# Patient Record
Sex: Female | Born: 1980 | Race: Black or African American | Hispanic: No | Marital: Single | Smoking: Former smoker
Health system: Southern US, Community
[De-identification: ages and names within clinical notes are randomized; demographics above are authoritative.]

## PROBLEM LIST (undated history)

## (undated) DIAGNOSIS — E669 Obesity, unspecified: Secondary | ICD-10-CM

## (undated) DIAGNOSIS — I2699 Other pulmonary embolism without acute cor pulmonale: Secondary | ICD-10-CM

## (undated) DIAGNOSIS — I10 Essential (primary) hypertension: Secondary | ICD-10-CM

## (undated) DIAGNOSIS — I219 Acute myocardial infarction, unspecified: Secondary | ICD-10-CM

## (undated) DIAGNOSIS — M549 Dorsalgia, unspecified: Secondary | ICD-10-CM

## (undated) HISTORY — PX: CORONARY ANGIOPLASTY WITH STENT PLACEMENT: SHX49

## (undated) HISTORY — PX: TUBAL LIGATION: SHX77

## (undated) HISTORY — PX: LAMINECTOMY: SHX219

---

## 2012-02-17 ENCOUNTER — Emergency Department (HOSPITAL_COMMUNITY)
Admission: EM | Admit: 2012-02-17 | Discharge: 2012-02-17 | Disposition: A | Discharge status: Home / Self Care | Payer: Medicaid - Out of State | Attending: Emergency Medicine | Admitting: Emergency Medicine

## 2012-02-17 ENCOUNTER — Encounter (HOSPITAL_COMMUNITY): Payer: Self-pay | Admitting: *Deleted

## 2012-02-17 ENCOUNTER — Emergency Department (HOSPITAL_COMMUNITY): Payer: Medicaid - Out of State

## 2012-02-17 DIAGNOSIS — R51 Headache: Secondary | ICD-10-CM | POA: Insufficient documentation

## 2012-02-17 DIAGNOSIS — R112 Nausea with vomiting, unspecified: Secondary | ICD-10-CM | POA: Insufficient documentation

## 2012-02-17 DIAGNOSIS — J3489 Other specified disorders of nose and nasal sinuses: Secondary | ICD-10-CM | POA: Insufficient documentation

## 2012-02-17 DIAGNOSIS — J40 Bronchitis, not specified as acute or chronic: Secondary | ICD-10-CM

## 2012-02-17 DIAGNOSIS — R0602 Shortness of breath: Secondary | ICD-10-CM | POA: Insufficient documentation

## 2012-02-17 DIAGNOSIS — IMO0001 Reserved for inherently not codable concepts without codable children: Secondary | ICD-10-CM | POA: Insufficient documentation

## 2012-02-17 DIAGNOSIS — R059 Cough, unspecified: Secondary | ICD-10-CM | POA: Insufficient documentation

## 2012-02-17 DIAGNOSIS — Z79899 Other long term (current) drug therapy: Secondary | ICD-10-CM | POA: Insufficient documentation

## 2012-02-17 DIAGNOSIS — R079 Chest pain, unspecified: Secondary | ICD-10-CM | POA: Insufficient documentation

## 2012-02-17 DIAGNOSIS — M549 Dorsalgia, unspecified: Secondary | ICD-10-CM | POA: Insufficient documentation

## 2012-02-17 DIAGNOSIS — R197 Diarrhea, unspecified: Secondary | ICD-10-CM | POA: Insufficient documentation

## 2012-02-17 DIAGNOSIS — R05 Cough: Secondary | ICD-10-CM | POA: Insufficient documentation

## 2012-02-17 DIAGNOSIS — J069 Acute upper respiratory infection, unspecified: Secondary | ICD-10-CM

## 2012-02-17 DIAGNOSIS — I10 Essential (primary) hypertension: Secondary | ICD-10-CM | POA: Insufficient documentation

## 2012-02-17 HISTORY — DX: Essential (primary) hypertension: I10

## 2012-02-17 MED ORDER — ALBUTEROL SULFATE HFA 108 (90 BASE) MCG/ACT IN AERS
2.0000 | INHALATION_SPRAY | Freq: Four times a day (QID) | RESPIRATORY_TRACT | Status: DC
  Administered 2012-02-17: 2 via RESPIRATORY_TRACT
  Filled 2012-02-17: qty 6.7

## 2012-02-17 NOTE — Discharge Instructions (Signed)
Bronchitis Bronchitis is a problem of the air tubes leading to your lungs. This problem makes it hard for air to get in and out of the lungs. You may cough a lot because your air tubes are narrow. Going without care can cause lasting (chronic) bronchitis. HOME CARE   Drink enough fluids to keep your pee (urine) clear or pale yellow.   Use a cool mist humidifier.   Quit smoking if you smoke. If you keep smoking, the bronchitis might not get better.   Only take medicine as told by your doctor.  GET HELP RIGHT AWAY IF:   Coughing keeps you awake.   You start to wheeze.   You become more sick or weak.   You have a hard time breathing or get short of breath.   You cough up blood.   Coughing lasts more than 2 weeks.   You have a fever.   Your baby is older than 3 months with a rectal temperature of 102 F (38.9 C) or higher.   Your baby is 79 months old or younger with a rectal temperature of 100.4 F (38 C) or higher.  MAKE SURE YOU:  Understand these instructions.   Will watch your condition.   Will get help right away if you are not doing well or get worse.  Document Released: 04/29/2008 Document Revised: 10/31/2011 Document Reviewed: 10/13/2009 [facility] Patient Information 2012 [place], [place].  Use albuterol inhaler as directed 2 puffs every 6 hours for at least one week then as needed. Also recommend Robitussin-DM over-the-counter cough medicine to help with the cough. Return for new worse symptoms.

## 2012-02-17 NOTE — ED Provider Notes (Signed)
History   This chart was scribed for Shelda Jakes, MD by Sofie Rower. The patient was seen in room APA07/APA07 and the patient's care was started at 7:44PM.    CSN: [id number]  Arrival date & time 02/17/12  1845   First MD Initiated Contact with Patient 02/17/12 1918      Chief Complaint  Patient presents with  . Shortness of Breath  . Generalized Body Aches  . Fever    (Consider location/radiation/quality/duration/timing/severity/associated sxs/prior treatment) HPI  Katie Pearson is a 31 y.o. female who presents to the Emergency Department complaining of moderate, intermittent productive green cough onset eight days ago with associated symptoms of myalgia, chest pain, headache, congestion, nausea, diarrhea, vomiting, back pain. Pt states she "has been experiencing muscle aches, which began to become worse this Sunday". Pt also informs the EDP that "she just began birth control pills six months ago". Modifying factors include tylenol, mucinex, ativan with moderate relief. Pt has a hx of high blood pressure.   Pt denies dysuria, abdominal pain, neck pain, swelling in the legs, rash.   LNMP was January, 2013.   PCP is Dr. Catalina Lunger in [place], [place].   Past Medical History  Diagnosis Date  . Hypertension     Past Surgical History  Procedure Date  . Cesarean section   . Tubal ligation   . Laminectomy     No family history on file.  History  Substance Use Topics  . Smoking status: Current Everyday Smoker  . Smokeless tobacco: Not on file  . Alcohol Use: No    OB History    Grav Para Term Preterm Abortions TAB SAB Ect Mult Living                  Review of Systems  All other systems reviewed and are negative.    10 Systems reviewed and are negative for acute change except as noted in the HPI.   Allergies  Ace inhibitors; Codeine; Latex; and Tape  Home Medications   Current Outpatient Rx  Name Route Sig Dispense Refill  . AMLODIPINE BESYLATE 5 MG PO TABS  Oral Take 5 mg by mouth daily.    Marland Kitchen CLONIDINE HCL 0.2 MG PO TABS Oral Take 0.2 mg by mouth 2 (two) times daily.    . FUROSEMIDE 40 MG PO TABS Oral Take 40 mg by mouth daily.    . GUAIFENESIN ER 600 MG PO TB12 Oral Take 1,200 mg by mouth 2 (two) times daily.    Marland Kitchen NORGESTREL-ETHINYL ESTRADIOL 0.3-30 MG-MCG PO TABS Oral Take 1 tablet by mouth daily.    . NYQUIL PO Oral Take 1 capsule by mouth as needed.      LMP 12/22/2011  Physical Exam  Nursing note and vitals reviewed. Constitutional: She is oriented to person, place, and time. She appears well-developed and well-nourished.  HENT:  Head: Normocephalic and atraumatic.  Right Ear: Tympanic membrane and external ear normal.  Left Ear: Tympanic membrane and external ear normal.  Nose: Nose normal.  Eyes: Conjunctivae and EOM are normal. No scleral icterus.  Neck: Neck supple. No thyromegaly present.  Cardiovascular: Normal rate, regular rhythm and normal heart sounds.  Exam reveals no gallop and no friction rub.   No murmur heard. Pulmonary/Chest: Breath sounds normal. No stridor. She has no wheezes. She has no rales. She exhibits no tenderness.  Abdominal: Bowel sounds are normal. She exhibits no distension. There is no tenderness. There is no rebound.  Musculoskeletal: Normal range of  motion. She exhibits no edema.  Lymphadenopathy:    She has no cervical adenopathy.  Neurological: She is alert and oriented to person, place, and time. Coordination normal.  Skin: Skin is warm and dry. No rash noted. No erythema.  Psychiatric: She has a normal mood and affect. Her behavior is normal.    ED Course  Procedures (including critical care time)  DIAGNOSTIC STUDIES:   COORDINATION OF CARE:     Results for orders placed during the hospital encounter of 02/17/12  POCT PREGNANCY, URINE      Component Value Range   Preg Test, Ur NEGATIVE  NEGATIVE    Dg Chest 2 View  02/17/2012  *RADIOLOGY REPORT*  Clinical Data: Shortness of breath   CHEST - 2 VIEW  Comparison: None  Findings: The heart size and mediastinal contours are within normal limits.  Both lungs are clear.  The visualized skeletal structures are unremarkable.  IMPRESSION: Negative exam.  Original Report Authenticated By: Rosealee Albee, M.D.        1. Upper respiratory infection   2. Bronchitis     7:51PM- EDP at bedside discusses treatment plan.   MDM  Suspect patient has upper respiratory infection viral in nature with bronchitis component. Chest x-ray negative for pneumonia no significant sore throat treat with albuterol inhaler and recommend Robitussin-DM for cough. Patient to return for new or worse symptoms. Also pregnancy test negative.      I personally performed the services described in this documentation, which was scribed in my presence. The recorded information has been reviewed and considered.  e   Shelda Jakes, MD 02/17/12 2136

## 2012-02-17 NOTE — ED Notes (Signed)
Pt presents to er with hoarness that started a week ago Sunday, pt states that she began worse since this Sunday with generalized body aches, chest pain, fever, cough with sputum production that is thick and yellow-green in color.

## 2014-09-08 ENCOUNTER — Emergency Department (HOSPITAL_COMMUNITY): Payer: Medicaid - Out of State

## 2014-09-08 ENCOUNTER — Emergency Department (HOSPITAL_COMMUNITY)
Admission: EM | Admit: 2014-09-08 | Discharge: 2014-09-08 | Disposition: A | Discharge status: Home / Self Care | Payer: Medicaid - Out of State | Attending: Emergency Medicine | Admitting: Emergency Medicine

## 2014-09-08 ENCOUNTER — Encounter (HOSPITAL_COMMUNITY): Payer: Self-pay | Admitting: Emergency Medicine

## 2014-09-08 DIAGNOSIS — Z91040 Latex allergy status: Secondary | ICD-10-CM | POA: Insufficient documentation

## 2014-09-08 DIAGNOSIS — Z79899 Other long term (current) drug therapy: Secondary | ICD-10-CM | POA: Insufficient documentation

## 2014-09-08 DIAGNOSIS — M545 Low back pain: Secondary | ICD-10-CM | POA: Diagnosis not present

## 2014-09-08 DIAGNOSIS — D369 Benign neoplasm, unspecified site: Secondary | ICD-10-CM

## 2014-09-08 DIAGNOSIS — M549 Dorsalgia, unspecified: Secondary | ICD-10-CM

## 2014-09-08 DIAGNOSIS — Z3202 Encounter for pregnancy test, result negative: Secondary | ICD-10-CM | POA: Diagnosis not present

## 2014-09-08 DIAGNOSIS — I10 Essential (primary) hypertension: Secondary | ICD-10-CM | POA: Diagnosis not present

## 2014-09-08 DIAGNOSIS — Z720 Tobacco use: Secondary | ICD-10-CM | POA: Diagnosis not present

## 2014-09-08 DIAGNOSIS — D279 Benign neoplasm of unspecified ovary: Secondary | ICD-10-CM | POA: Diagnosis not present

## 2014-09-08 DIAGNOSIS — G8929 Other chronic pain: Secondary | ICD-10-CM | POA: Insufficient documentation

## 2014-09-08 DIAGNOSIS — M546 Pain in thoracic spine: Secondary | ICD-10-CM | POA: Diagnosis not present

## 2014-09-08 DIAGNOSIS — Z86711 Personal history of pulmonary embolism: Secondary | ICD-10-CM | POA: Insufficient documentation

## 2014-09-08 HISTORY — DX: Other pulmonary embolism without acute cor pulmonale: I26.99

## 2014-09-08 LAB — URINALYSIS, ROUTINE W REFLEX MICROSCOPIC
Bilirubin Urine: NEGATIVE
GLUCOSE, UA: NEGATIVE mg/dL
Hgb urine dipstick: NEGATIVE
KETONES UR: NEGATIVE mg/dL
LEUKOCYTES UA: NEGATIVE
NITRITE: NEGATIVE
PH: 6 (ref 5.0–8.0)
Protein, ur: NEGATIVE mg/dL
Urobilinogen, UA: 0.2 mg/dL (ref 0.0–1.0)

## 2014-09-08 LAB — CBC WITH DIFFERENTIAL/PLATELET
BASOS ABS: 0 10*3/uL (ref 0.0–0.1)
BASOS PCT: 0 % (ref 0–1)
EOS PCT: 1 % (ref 0–5)
Eosinophils Absolute: 0.1 10*3/uL (ref 0.0–0.7)
HCT: 36.2 % (ref 36.0–46.0)
HEMOGLOBIN: 12.3 g/dL (ref 12.0–15.0)
Lymphocytes Relative: 43 % (ref 12–46)
Lymphs Abs: 3.4 10*3/uL (ref 0.7–4.0)
MCH: 30.1 pg (ref 26.0–34.0)
MCHC: 34 g/dL (ref 30.0–36.0)
MCV: 88.7 fL (ref 78.0–100.0)
MONO ABS: 0.5 10*3/uL (ref 0.1–1.0)
Monocytes Relative: 6 % (ref 3–12)
Neutro Abs: 4 10*3/uL (ref 1.7–7.7)
Neutrophils Relative %: 50 % (ref 43–77)
Platelets: 308 10*3/uL (ref 150–400)
RBC: 4.08 MIL/uL (ref 3.87–5.11)
RDW: 14.8 % (ref 11.5–15.5)
WBC: 8 10*3/uL (ref 4.0–10.5)

## 2014-09-08 LAB — BASIC METABOLIC PANEL
ANION GAP: 15 (ref 5–15)
BUN: 12 mg/dL (ref 6–23)
CALCIUM: 9.9 mg/dL (ref 8.4–10.5)
CO2: 22 meq/L (ref 19–32)
CREATININE: 0.7 mg/dL (ref 0.50–1.10)
Chloride: 101 mEq/L (ref 96–112)
GFR calc non Af Amer: >90 mL/min (ref >90)
Glucose, Bld: 83 mg/dL (ref 70–99)
Potassium: 4.3 mEq/L (ref 3.7–5.3)
Sodium: 138 mEq/L (ref 137–147)

## 2014-09-08 LAB — PREGNANCY, URINE: PREG TEST UR: NEGATIVE

## 2014-09-08 MED ORDER — IBUPROFEN 800 MG PO TABS: 800.0000 mg | ORAL_TABLET | Freq: Three times a day (TID) | ORAL | Status: DC

## 2014-09-08 MED ORDER — ONDANSETRON HCL 4 MG PO TABS: 4.0000 mg | ORAL_TABLET | Freq: Four times a day (QID) | ORAL | Status: DC

## 2014-09-08 MED ORDER — OXYCODONE-ACETAMINOPHEN 5-325 MG PO TABS: 2.0000 | ORAL_TABLET | ORAL | Status: DC | PRN

## 2014-09-08 MED ORDER — CLONIDINE HCL 0.2 MG PO TABS: 0.2000 mg | ORAL_TABLET | Freq: Two times a day (BID) | ORAL | Status: DC

## 2014-09-08 MED ORDER — HYDROMORPHONE HCL 1 MG/ML IJ SOLN
1.0000 mg | Freq: Once | INTRAMUSCULAR | Status: AC
Start: 2014-09-08 — End: 2014-09-08
  Administered 2014-09-08: 1 mg via INTRAMUSCULAR
  Filled 2014-09-08: qty 1

## 2014-09-08 MED ORDER — IOHEXOL 350 MG/ML SOLN
100.0000 mL | Freq: Once | INTRAVENOUS | Status: AC | PRN
  Administered 2014-09-08: 100 mL via INTRAVENOUS

## 2014-09-08 MED ORDER — KETOROLAC TROMETHAMINE 60 MG/2ML IM SOLN
60.0000 mg | Freq: Once | INTRAMUSCULAR | Status: AC
  Administered 2014-09-08: 60 mg via INTRAMUSCULAR
  Filled 2014-09-08: qty 2

## 2014-09-08 MED ORDER — CLONIDINE HCL 0.1 MG PO TABS
0.1000 mg | ORAL_TABLET | Freq: Once | ORAL | Status: AC
  Administered 2014-09-08: 0.1 mg via ORAL
  Filled 2014-09-08: qty 1

## 2014-09-08 NOTE — ED Provider Notes (Signed)
CSN: [id number]     Arrival date & time 09/08/14  1141 History  This chart was scribed for Katie Essex, MD by Rayfield Citizen, ED Scribe. This patient was seen in room APA07/APA07 and the patient's care was started at 1:12 PM.    Chief Complaint  Patient presents with  . Back Pain   The history is provided by the patient. No language interpreter was used.   HPI Comments: Katie Pearson is a 33 y.o. female who presents to the Emergency Department complaining of middle and lower back pain. Her back pain has increased from her baseline chronic pain over the last week. She fell down approximately three steps yesterday due to a simple mechanical fall (slipped while holding her cane and a handrail); woke up this morning and her pain became unbearable, prompting her to come in to the ED. She explains "couldn't put weight on her legs due to spasms" this morning. She reports new onset constant weakness in her legs, but no numbness or paresthesia. She reports slight abdominal pain, though she believes this may be due to constipation. She reports slight incontinence; she explains that she is aware of when she has to urinate and can normally make it to the restroom, but has trouble holding her urine; this issue is new to her within the last week. She denies fevers, vomiting, or chest pain.   She has a baseline level of chronic pain due to a previous surgical history (laminectomy) several years prior. This fall has increased her pain.   She has high blood pressure; she took her last dose of that medication at 0730 this morning. She has an appointment in 6 days in [place].  She has a history of PE (2014) but she is not on blood thinners at this time.   Past Medical History  Diagnosis Date  . Hypertension   . Pulmonary emboli    Past Surgical History  Procedure Laterality Date  . Cesarean section    . Tubal ligation    . Laminectomy     History reviewed. No pertinent family history. History   Substance Use Topics  . Smoking status: Current Every Day Smoker  . Smokeless tobacco: Not on file  . Alcohol Use: No   OB History   Grav Para Term Preterm Abortions TAB SAB Ect Mult Living                 Review of Systems  Musculoskeletal: Positive for back pain.    A complete 10 system review of systems was obtained and all systems are negative except as noted in the HPI and PMH.    Allergies  Ace inhibitors; Codeine; Latex; and Tape  Home Medications   Prior to Admission medications   Medication Sig Start Date End Date Taking? Authorizing Provider  Aspirin-Caffeine (BAYER BACK & BODY PAIN EX ST PO) Take 4 tablets by mouth 3 (three) times daily as needed (pain).   Yes Historical Provider, MD  cyclobenzaprine (FLEXERIL) 10 MG tablet Take 10 mg by mouth every 8 (eight) hours as needed for muscle spasms.   Yes Historical Provider, MD  HYDROcodone-acetaminophen (NORCO/VICODIN) 5-325 MG per tablet Take 1 tablet by mouth every 6 (six) hours as needed for moderate pain.   Yes Historical Provider, MD  cloNIDine (CATAPRES) 0.2 MG tablet Take 1 tablet (0.2 mg total) by mouth 2 (two) times daily. 09/08/14   Katie Essex, MD  ibuprofen (ADVIL,MOTRIN) 800 MG tablet Take 1 tablet (800 mg total) by mouth  3 (three) times daily. 09/08/14   Katie Essex, MD  ondansetron (ZOFRAN) 4 MG tablet Take 1 tablet (4 mg total) by mouth every 6 (six) hours. 09/08/14   Katie Essex, MD  oxyCODONE-acetaminophen (PERCOCET/ROXICET) 5-325 MG per tablet Take 2 tablets by mouth every 4 (four) hours as needed for severe pain. 09/08/14   Katie Essex, MD   BP 167/104  Pulse 71  Temp(Src) 98.1 F (36.7 C) (Oral)  Resp 16  Ht 5\' 5"  (1.651 m)  Wt 278 lb (126.1 kg)  BMI 46.26 kg/m2  SpO2 100%  LMP 08/25/2014 Physical Exam  Nursing note and vitals reviewed. Constitutional: She is oriented to person, place, and time. She appears well-developed and well-nourished. No distress.  HENT:  Head:  Normocephalic and atraumatic.  Mouth/Throat: Oropharynx is clear and moist. No oropharyngeal exudate.  Eyes: Conjunctivae and EOM are normal. Pupils are equal, round, and reactive to light.  Neck: Normal range of motion. Neck supple.  No meningismus.  Cardiovascular: Normal rate, regular rhythm, normal heart sounds and intact distal pulses.   No murmur heard. Pulmonary/Chest: Effort normal and breath sounds normal. No respiratory distress.  Abdominal: Soft. There is no tenderness. There is no rebound and no guarding.  Musculoskeletal: Normal range of motion. She exhibits no edema and no tenderness.  Diffuse thoracic and lumbar paraspinal tenderness. 5/5 strength in bilateral lower extremities. Ankle plantar and dorsiflexion intact. Great toe extension intact bilaterally. +2 DP and PT pulses. +2 patellar reflexes bilaterally. Normal gait.   Neurological: She is alert and oriented to person, place, and time. No cranial nerve deficit. She exhibits normal muscle tone. Coordination normal.  No ataxia on finger to nose bilaterally. No pronator drift. 5/5 strength throughout. CN 2-12 intact. Negative Romberg. Equal grip strength. Sensation intact. Gait is normal.   Skin: Skin is warm.  Psychiatric: She has a normal mood and affect. Her behavior is normal.    ED Course  Procedures   DIAGNOSTIC STUDIES: Oxygen Saturation is 100% on RA, normal by my interpretation.    COORDINATION OF CARE: 11:37 PM Discussed treatment plan with pt at bedside and pt agreed to plan.   Labs Review Labs Reviewed  URINALYSIS, ROUTINE W REFLEX MICROSCOPIC - Abnormal; Notable for the following:    Specific Gravity, Urine <1.005 (*)    All other components within normal limits  PREGNANCY, URINE  CBC WITH DIFFERENTIAL  BASIC METABOLIC PANEL    Imaging Review Ct Angio Chest Pe W/cm &/or Wo Cm  09/08/2014   CLINICAL DATA:  Constant severe low back pain. History of pulmonary emboli in 2014. Fall down steps  yesterday. Difficulty walking. MRI this morning showed ovarian cyst.  EXAM: CT ANGIOGRAPHY CHEST  CT ABDOMEN AND PELVIS WITH CONTRAST  TECHNIQUE: Multidetector CT imaging of the chest was performed using the standard protocol during bolus administration of intravenous contrast. Multiplanar CT image reconstructions and MIPs were obtained to evaluate the vascular anatomy. Multidetector CT imaging of the abdomen and pelvis was performed using the standard protocol during bolus administration of intravenous contrast.  CONTRAST:  151mL OMNIPAQUE IOHEXOL 350 MG/ML SOLN  COMPARISON:  None.  FINDINGS: CTA CHEST FINDINGS  Bones: Normal.  Cardiovascular: Negative for pulmonary embolism. Bovine aortic arch. No acute aortic abnormality.  Lungs: Dependent atelectasis.  Central airways: Patent.  Effusions: None.  Lymphadenopathy: None.  Esophagus: Normal.  CT ABDOMEN and PELVIS FINDINGS  Musculoskeletal: Lumbar spondylosis deferred MRI are earlier today. No aggressive osseous lesions.  Liver:  Normal.  Spleen:  Normal.  Gallbladder:  Normal.  Common bile duct:  Normal.  Pancreas:  Normal.  Adrenal glands:  Normal bilaterally.  Kidneys: Normal. Enhancement is within normal limits. No calculi. No hydronephrosis. Delayed excretion of contrast is also normal. Both ureters appear normal.  Stomach:  Normal.  Small bowel:  Normal.  Colon: Large stool burden. No RIGHT lower quadrant inflammatory changes. : Within normal limits.  Pelvic Genitourinary: Uterus appears normal. Urinary bladder is collapsed. No free fluid. Dermoid cyst is present in the LEFT anatomic pelvis, measuring 5.9 x 5.0 cm on axial imaging and 5.8 cm craniocaudal.  Vasculature: Normal.  Body Wall: Soft tissue attenuation is present in the patient's panniculus. The larger measures 58 mm x 36 mm with a smaller measuring 38 mm x 24 mm. This extends anterior to the LEFT femoral canal. This may be postsurgical. This could also represent scarring or an etiology such as a  desmoid however the CT appearance is nonspecific.  IMPRESSION: 1. Negative CTA chest. 2. 5.9 x 5.0 x 5.8 cm LEFT adnexal dermoid cyst. Follow-up gynecologic consultation recommended. 3. Soft tissue nodules in the anterior abdominal wall subcutaneous fat. The appearance is nonspecific. Potentially these represent areas of prior trauma, scarring or other etiologies such as desmoid.   Electronically Signed   By: Dereck Ligas M.D.   On: 09/08/2014 17:30   Mr Lumbar Spine Wo Contrast  09/08/2014   CLINICAL DATA:  33 year old female with low back pain x2 weeks suspected due to lifting injury. Initial encounter.  EXAM: MRI LUMBAR SPINE WITHOUT CONTRAST  TECHNIQUE: Multiplanar, multisequence MR imaging of the lumbar spine was performed. No intravenous contrast was administered.  COMPARISON:  None.  FINDINGS: Large body habitus. Lumbar segmentation appears to be normal and will be designated as such for this report. Vertebral height and alignment are within normal limits. There is mild superior endplate marrow edema at L5 which appears to be degenerative in nature. No acute osseous abnormality identified.  Visualized lower thoracic spinal cord is normal with conus medularis at T12-L1.  Visualized abdominal viscera and paraspinal soft tissues are within normal limits.  Partially visible left hemipelvis cysts measuring at least 4.5 cm diameter. This is probably associated with the left adnexa.  T11-T12:  Negative except for mild facet hypertrophy.  T12-L1:  Negative.  L1-L2:  Negative.  L2-L3:  Negative.  L3-L4: Disc desiccation. Circumferential disc bulge. Superimposed central cephalad disc extrusion best seen on series 3, image 8 and series 6, image 24. Superimposed mild to moderate facet hypertrophy greater on the left with trace facet joint fluid. Mild spinal stenosis. Disc material mildly affects both L3 neural foramina.  L4-L5: Disc desiccation. Circumferential disc bulge. Superimposed right paracentral disc  extrusion (series 3, image 8 and series 6, image 32). Superimposed mild facet hypertrophy. Mild epidural lipomatosis. Mild spinal stenosis. Moderate right lateral recess stenosis affecting the course of the descending right L5 nerve roots, possibly also the exiting right L4 nerve. No significant left foraminal stenosis.  L5-S1: Partially sacralized L5 level. Vestigial disc. No stenosis.  IMPRESSION: 1. 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 Correlation with radiographs is recommended prior to any operative intervention. 2. 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 3. Partially visible cyst in the left hemipelvis measuring at least 4.5 cm, probably associated with the left adnexal and may be physiologic.   Electronically Signed   By: Lars Pinks M.D.   On:  09/08/2014 15:33   Ct Abdomen Pelvis W Contrast  09/08/2014   CLINICAL DATA:  Constant severe low back pain. History of pulmonary emboli in 2014. Fall down steps yesterday. Difficulty walking. MRI this morning showed ovarian cyst.  EXAM: CT ANGIOGRAPHY CHEST  CT ABDOMEN AND PELVIS WITH CONTRAST  TECHNIQUE: Multidetector CT imaging of the chest was performed using the standard protocol during bolus administration of intravenous contrast. Multiplanar CT image reconstructions and MIPs were obtained to evaluate the vascular anatomy. Multidetector CT imaging of the abdomen and pelvis was performed using the standard protocol during bolus administration of intravenous contrast.  CONTRAST:  161mL OMNIPAQUE IOHEXOL 350 MG/ML SOLN  COMPARISON:  None.  FINDINGS: CTA CHEST FINDINGS  Bones: Normal.  Cardiovascular: Negative for pulmonary embolism. Bovine aortic arch. No acute aortic abnormality.  Lungs: Dependent atelectasis.  Central airways: Patent.  Effusions: None.  Lymphadenopathy: None.  Esophagus: Normal.  CT ABDOMEN and PELVIS FINDINGS  Musculoskeletal: Lumbar  spondylosis deferred MRI are earlier today. No aggressive osseous lesions.  Liver:  Normal.  Spleen:  Normal.  Gallbladder:  Normal.  Common bile duct:  Normal.  Pancreas:  Normal.  Adrenal glands:  Normal bilaterally.  Kidneys: Normal. Enhancement is within normal limits. No calculi. No hydronephrosis. Delayed excretion of contrast is also normal. Both ureters appear normal.  Stomach:  Normal.  Small bowel:  Normal.  Colon: Large stool burden. No RIGHT lower quadrant inflammatory changes. : Within normal limits.  Pelvic Genitourinary: Uterus appears normal. Urinary bladder is collapsed. No free fluid. Dermoid cyst is present in the LEFT anatomic pelvis, measuring 5.9 x 5.0 cm on axial imaging and 5.8 cm craniocaudal.  Vasculature: Normal.  Body Wall: Soft tissue attenuation is present in the patient's panniculus. The larger measures 58 mm x 36 mm with a smaller measuring 38 mm x 24 mm. This extends anterior to the LEFT femoral canal. This may be postsurgical. This could also represent scarring or an etiology such as a desmoid however the CT appearance is nonspecific.  IMPRESSION: 1. Negative CTA chest. 2. 5.9 x 5.0 x 5.8 cm LEFT adnexal dermoid cyst. Follow-up gynecologic consultation recommended. 3. Soft tissue nodules in the anterior abdominal wall subcutaneous fat. The appearance is nonspecific. Potentially these represent areas of prior trauma, scarring or other etiologies such as desmoid.   Electronically Signed   By: Dereck Ligas M.D.   On: 09/08/2014 17:30     EKG Interpretation None      MDM   Final diagnoses:  Back pain  Dermoid cyst   Acute on chronic low back pain worse after fall 3 days ago. History of laminectomy 2011. endorses urinary urgency with dribbling. No focal weakness, numbness or tingling.  MRI obtained in setting of urinary incontinence.  No evidence of cord compression or cauda equina. They're disc extrusion at L3/L4 and L4/L5 with mild stenosis and right lateral recess  stenosis. She is able to ambulate.  Cystic lesion seen in left hemipelvis. Large dermoid cyst on CT.  Referral to gynecology for followup.  Clinically does not have ovarian torsion.  BP remains elevated. Home clonidine given and prescription provided. Return precautions discussed. BP 167/104  Pulse 71  Temp(Src) 98.1 F (36.7 C) (Oral)  Resp 16  Ht 5\' 5"  (1.651 m)  Wt 278 lb (126.1 kg)  BMI 46.26 kg/m2  SpO2 100%  LMP 08/25/2014   I personally performed the services described in this documentation, which was scribed in my presence. The recorded information has been reviewed  and is accurate.      Katie Essex, MD 09/08/14 [phone]

## 2014-09-08 NOTE — ED Notes (Signed)
Dr. Rancour at bedside. 

## 2014-09-08 NOTE — Discharge Instructions (Signed)
Back Pain, Adult Followup with the gynecologist regarding your ovarian cysts. Take the pain medication as prescribed. Return to the ED if you develop new or worsening symptoms. Low back pain is very common. About 1 in 5 people have back pain.The cause of low back pain is rarely dangerous. The pain often gets better over time.About half of people with a sudden onset of back pain feel better in just 2 weeks. About 8 in 10 people feel better by 6 weeks.  CAUSES Some common causes of back pain include:  Strain of the muscles or ligaments supporting the spine.  Wear and tear (degeneration) of the spinal discs.  Arthritis.  Direct injury to the back. DIAGNOSIS Most of the time, the direct cause of low back pain is not known.However, back pain can be treated effectively even when the exact cause of the pain is unknown.Answering your caregiver's questions about your overall health and symptoms is one of the most accurate ways to make sure the cause of your pain is not dangerous. If your caregiver needs more information, he or she may order lab work or imaging tests (X-rays or MRIs).However, even if imaging tests show changes in your back, this usually does not require surgery. HOME CARE INSTRUCTIONS For many people, back pain returns.Since low back pain is rarely dangerous, it is often a condition that people can learn to [facility] their own.   Remain active. It is stressful on the back to sit or stand in one place. Do not sit, drive, or stand in one place for more than 30 minutes at a time. Take short walks on level surfaces as soon as pain allows.Try to increase the length of time you walk each day.  Do not stay in bed.Resting more than 1 or 2 days can delay your recovery.  Do not avoid exercise or work.Your body is made to move.It is not dangerous to be active, even though your back may hurt.Your back will likely heal faster if you return to being active before your pain is gone.  Pay  attention to your body when you bend and lift. Many people have less discomfortwhen lifting if they bend their knees, keep the load close to their bodies,and avoid twisting. Often, the most comfortable positions are those that put less stress on your recovering back.  Find a comfortable position to sleep. Use a firm mattress and lie on your side with your knees slightly bent. If you lie on your back, put a pillow under your knees.  Only take over-the-counter or prescription medicines as directed by your caregiver. Over-the-counter medicines to reduce pain and inflammation are often the most helpful.Your caregiver may prescribe muscle relaxant drugs.These medicines help dull your pain so you can more quickly return to your normal activities and healthy exercise.  Put ice on the injured area.  Put ice in a plastic bag.  Place a towel between your skin and the bag.  Leave the ice on for 15-20 minutes, 03-04 times a day for the first 2 to 3 days. After that, ice and heat may be alternated to reduce pain and spasms.  Ask your caregiver about trying back exercises and gentle massage. This may be of some benefit.  Avoid feeling anxious or stressed.Stress increases muscle tension and can worsen back pain.It is important to recognize when you are anxious or stressed and learn ways to manage it.Exercise is a great option. SEEK MEDICAL CARE IF:  You have pain that is not relieved with rest or  medicine.  You have pain that does not improve in 1 week.  You have new symptoms.  You are generally not feeling well. SEEK IMMEDIATE MEDICAL CARE IF:   You have pain that radiates from your back into your legs.  You develop new bowel or bladder control problems.  You have unusual weakness or numbness in your arms or legs.  You develop nausea or vomiting.  You develop abdominal pain.  You feel faint. Document Released: 11/11/2005 Document Revised: 05/12/2012 Document Reviewed:  03/15/2014 [facility] Patient Information 2015 [place], [place]. This information is not intended to replace advice given to you by your health care provider. Make sure you discuss any questions you have with your health care provider.

## 2014-09-08 NOTE — ED Notes (Signed)
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 ? Injury from that.  Hx of back surgery.  Seen at [facility] for same within last 2 weeks.  Says she is out of her bp meds also.

## 2016-07-28 ENCOUNTER — Emergency Department (HOSPITAL_BASED_OUTPATIENT_CLINIC_OR_DEPARTMENT_OTHER): Payer: Medicaid - Out of State

## 2016-07-28 ENCOUNTER — Encounter (HOSPITAL_BASED_OUTPATIENT_CLINIC_OR_DEPARTMENT_OTHER): Payer: Self-pay | Admitting: Emergency Medicine

## 2016-07-28 ENCOUNTER — Emergency Department (HOSPITAL_BASED_OUTPATIENT_CLINIC_OR_DEPARTMENT_OTHER)
Admission: EM | Admit: 2016-07-28 | Discharge: 2016-07-28 | Disposition: A | Discharge status: Home / Self Care | Payer: Medicaid - Out of State | Attending: Emergency Medicine | Admitting: Emergency Medicine

## 2016-07-28 DIAGNOSIS — R0602 Shortness of breath: Secondary | ICD-10-CM

## 2016-07-28 DIAGNOSIS — F17200 Nicotine dependence, unspecified, uncomplicated: Secondary | ICD-10-CM | POA: Insufficient documentation

## 2016-07-28 DIAGNOSIS — Z79899 Other long term (current) drug therapy: Secondary | ICD-10-CM | POA: Insufficient documentation

## 2016-07-28 DIAGNOSIS — J189 Pneumonia, unspecified organism: Secondary | ICD-10-CM

## 2016-07-28 DIAGNOSIS — Z7982 Long term (current) use of aspirin: Secondary | ICD-10-CM | POA: Insufficient documentation

## 2016-07-28 DIAGNOSIS — I10 Essential (primary) hypertension: Secondary | ICD-10-CM | POA: Insufficient documentation

## 2016-07-28 HISTORY — DX: Acute myocardial infarction, unspecified: I21.9

## 2016-07-28 LAB — CBC WITH DIFFERENTIAL/PLATELET
BASOS ABS: 0 10*3/uL (ref 0.0–0.1)
BASOS PCT: 0 %
EOS PCT: 1 %
Eosinophils Absolute: 0.1 10*3/uL (ref 0.0–0.7)
HEMATOCRIT: 33.1 % — AB (ref 36.0–46.0)
Hemoglobin: 11.2 g/dL — ABNORMAL LOW (ref 12.0–15.0)
Lymphocytes Relative: 34 %
Lymphs Abs: 2.7 10*3/uL (ref 0.7–4.0)
MCH: 29.8 pg (ref 26.0–34.0)
MCHC: 33.8 g/dL (ref 30.0–36.0)
MCV: 88 fL (ref 78.0–100.0)
MONO ABS: 0.5 10*3/uL (ref 0.1–1.0)
MONOS PCT: 6 %
NEUTROS ABS: 4.8 10*3/uL (ref 1.7–7.7)
Neutrophils Relative %: 59 %
PLATELETS: 323 10*3/uL (ref 150–400)
RBC: 3.76 MIL/uL — ABNORMAL LOW (ref 3.87–5.11)
RDW: 15.5 % (ref 11.5–15.5)
WBC: 8.1 10*3/uL (ref 4.0–10.5)

## 2016-07-28 LAB — BRAIN NATRIURETIC PEPTIDE: B Natriuretic Peptide: 118.4 pg/mL — ABNORMAL HIGH (ref 0.0–100.0)

## 2016-07-28 LAB — BASIC METABOLIC PANEL
ANION GAP: 9 (ref 5–15)
BUN: 15 mg/dL (ref 6–20)
CALCIUM: 9 mg/dL (ref 8.9–10.3)
CO2: 22 mmol/L (ref 22–32)
CREATININE: 0.84 mg/dL (ref 0.44–1.00)
Chloride: 105 mmol/L (ref 101–111)
GFR calc Af Amer: >60 mL/min (ref >60)
GLUCOSE: 118 mg/dL — AB (ref 65–99)
Potassium: 3.4 mmol/L — ABNORMAL LOW (ref 3.5–5.1)
Sodium: 136 mmol/L (ref 135–145)

## 2016-07-28 LAB — D-DIMER, QUANTITATIVE (NOT AT ARMC): D DIMER QUANT: 1.16 ug{FEU}/mL — AB (ref 0.00–0.50)

## 2016-07-28 LAB — TROPONIN I

## 2016-07-28 MED ORDER — AMOXICILLIN-POT CLAVULANATE 875-125 MG PO TABS: 1.0000 | ORAL_TABLET | Freq: Two times a day (BID) | ORAL | 0 refills | Status: DC

## 2016-07-28 MED ORDER — IOPAMIDOL (ISOVUE-370) INJECTION 76%
100.0000 mL | Freq: Once | INTRAVENOUS | Status: AC | PRN
  Administered 2016-07-28: 100 mL via INTRAVENOUS

## 2016-07-28 MED ORDER — LORAZEPAM 2 MG/ML IJ SOLN
1.0000 mg | Freq: Once | INTRAMUSCULAR | Status: AC
  Administered 2016-07-28: 1 mg via INTRAVENOUS
  Filled 2016-07-28: qty 1

## 2016-07-28 NOTE — ED Triage Notes (Signed)
Patient states that she is having SOB that started last night when she was arguing with someone. The patient states that it she worse when she lays down - it stopped last night  - . Patient is anxious and having tachypnea in the triage

## 2016-07-28 NOTE — ED Notes (Signed)
Patient reports while having intercourse last night she suddenly became SOB. Patient initially tachypnic when this nurse entered the room. While speaking to MD patient's respirations slowed and she is able to speak in complete sentences.

## 2016-07-28 NOTE — ED Notes (Signed)
MD at bedside. 

## 2016-07-28 NOTE — ED Provider Notes (Signed)
[place] [facility] Provider Note   CSN: [id number] Arrival date & time: 07/28/16  1358     History   Chief Complaint Chief Complaint  Patient presents with  . Shortness of Breath    HPI Katie Pearson is a 35 y.o. female.  The patient is a 35 year old female, she has multiple medical problems including hypertension, hypercholesterolemia, she has a history of myocardial infarction which has occurred twice over the last year and a half for which she has received stents as well as a history of pulmonary embolism approximately one year ago. She hasn't occasion to be placed on anticoagulants including Eliquis, she could not continue taking this because of cost and ended up having another myocardial infarction when she was not taking anticoagulants. She reports that she currently takes clopidogrel daily. She states that usually she does not have the symptoms started last night however after having on and off arguing and fighting with her boyfriend throughout the course of the evening he decided to engage in coitus approximately 1:00. While in process, the patient developed acute onset of shortness of breath and felt as though she needed to stop and take an albuterol treatment (no history of reactive airway disease). She used one of her children's inhalers, felt slightly better, noted that she continued to be short of breath on and off throughout the evening. She does report a history of anxiety for which she takes medications including Wellbutrin. The patient denies any recent swelling of her legs, she wouldn't denies any chest pain to this provider. She reports that the shortness of breath comes and goes.   The history is provided by the patient and medical records.  Shortness of Breath     Past Medical History:  Diagnosis Date  . Hypertension   . MI (myocardial infarction) ([place])   . Pulmonary emboli (H[facility])     There are no active problems to display for this patient.   Past Surgical  History:  Procedure Laterality Date  . CESAREAN SECTION    . LAMINECTOMY    . TUBAL LIGATION      OB History    No data available       Home Medications    Prior to Admission medications   Medication Sig Start Date End Date Taking? Authorizing Provider  atorvastatin (LIPITOR) 20 MG tablet Take 20 mg by mouth daily.   Yes Historical Provider, MD  clopidogrel (PLAVIX) 75 MG tablet Take 75 mg by mouth daily.   Yes Historical Provider, MD  amLODipine (NORVASC) 5 MG tablet Take 5 mg by mouth daily.    Historical Provider, MD  amoxicillin-clavulanate (AUGMENTIN) 875-125 MG tablet Take 1 tablet by mouth every 12 (twelve) hours. 07/28/16   Noemi Chapel, MD  Aspirin-Caffeine (BAYER BACK & BODY PAIN EX ST PO) Take 4 tablets by mouth 3 (three) times daily as needed (pain).    Historical Provider, MD  cloNIDine (CATAPRES) 0.2 MG tablet Take 1 tablet (0.2 mg total) by mouth 2 (two) times daily. 09/08/14   Ezequiel Essex, MD  cyclobenzaprine (FLEXERIL) 10 MG tablet Take 10 mg by mouth every 8 (eight) hours as needed for muscle spasms.    Historical Provider, MD  HYDROcodone-acetaminophen (NORCO/VICODIN) 5-325 MG per tablet Take 1 tablet by mouth every 6 (six) hours as needed for moderate pain.    Historical Provider, MD  ondansetron (ZOFRAN) 4 MG tablet Take 1 tablet (4 mg total) by mouth every 6 (six) hours. 09/08/14   Ezequiel Essex, MD  Family History History reviewed. No pertinent family history.  Social History Social History  Substance Use Topics  . Smoking status: Current Every Day Smoker  . Smokeless tobacco: Never Used  . Alcohol use No     Allergies   Ace inhibitors; Codeine; Latex; and Tape   Review of Systems Review of Systems  Respiratory: Positive for shortness of breath.   All other systems reviewed and are negative.    Physical Exam Updated Vital Signs BP 105/59   Pulse 64   Temp 98.5 F (36.9 C) (Oral)   Resp 22   Ht 5\' 5"  (1.651 m)   Wt 260 lb (117.9  kg)   LMP 07/18/2016   SpO2 97%   BMI 43.27 kg/m   Physical Exam  Constitutional: She appears well-developed and well-nourished. No distress.  HENT:  Head: Normocephalic and atraumatic.  Mouth/Throat: Oropharynx is clear and moist. No oropharyngeal exudate.  Eyes: Conjunctivae and EOM are normal. Pupils are equal, round, and reactive to light. Right eye exhibits no discharge. Left eye exhibits no discharge. No scleral icterus.  Neck: Normal range of motion. Neck supple. No JVD present. No thyromegaly present.  Cardiovascular: Normal rate, regular rhythm, normal heart sounds and intact distal pulses.  Exam reveals no gallop and no friction rub.   No murmur heard. Pulmonary/Chest: Effort normal. No respiratory distress. She has no wheezes. She has rales ( Scattered rales at the bases that clears with deep breathing and coughing).  Initially during the interview the patient was significantly tachypneic breathing approximately 20-25 times per minute however midway during the history taking the patient obtained a normal respiratory rate with normal work of breathing, states that she immediately felt better  Abdominal: Soft. Bowel sounds are normal. She exhibits no distension and no mass. There is no tenderness.  Musculoskeletal: Normal range of motion. She exhibits no edema or tenderness.  Lymphadenopathy:    She has no cervical adenopathy.  Neurological: She is alert. Coordination normal.  Skin: Skin is warm and dry. No rash noted. No erythema.  Psychiatric: She has a normal mood and affect. Her behavior is normal.  Nursing note and vitals reviewed.    ED Treatments / Results  Labs (all labs ordered are listed, but only abnormal results are displayed) Labs Reviewed  D-DIMER, QUANTITATIVE (NOT AT [facility]) - Abnormal; Notable for the following:       Result Value   D-Dimer, Quant 1.16 (*)    All other components within normal limits  BASIC METABOLIC PANEL - Abnormal; Notable for the  following:    Potassium 3.4 (*)    Glucose, Bld 118 (*)    All other components within normal limits  CBC WITH DIFFERENTIAL/PLATELET - Abnormal; Notable for the following:    RBC 3.76 (*)    Hemoglobin 11.2 (*)    HCT 33.1 (*)    All other components within normal limits  BRAIN NATRIURETIC PEPTIDE - Abnormal; Notable for the following:    B Natriuretic Peptide 118.4 (*)    All other components within normal limits  TROPONIN I    EKG  EKG Interpretation  Date/Time:  Sunday July 28 2016 14:07:43 EDT Ventricular Rate:  77 PR Interval:  146 QRS Duration: 80 QT Interval:  422 QTC Calculation: 477 R Axis:   16 Text Interpretation:  Normal sinus rhythm Normal ECG No old tracing to compare Confirmed by Bynum Mccullars  MD, [place] ([id number]) on 07/28/2016 3:15:29 PM       Radiology Dg Chest 2  View  Result Date: 07/28/2016 CLINICAL DATA:  35 year old female with a history of chest pressure EXAM: CHEST  2 VIEW COMPARISON:  Chest x-ray 02/17/2012, CT 09/08/2014 FINDINGS: Cardiomediastinal silhouette within normal limits. No pneumothorax or pleural effusion. No evidence of confluent airspace disease. Similar appearance of coarsened interstitial markings compared to the plain film of 2013. No displaced fracture. IMPRESSION: No radiographic evidence of acute cardiopulmonary disease. Signed, Dulcy Fanny. Earleen Newport, DO [facility] Radiology Electronically Signed   By: [name] D.O.   On: 07/28/2016 15:05   Ct Angio Chest Pe W Or Wo Contrast  Result Date: 07/28/2016 CLINICAL DATA:  Acute onset shortness of breath and chest pain today. Elevated D-dimer. EXAM: CT ANGIOGRAPHY CHEST WITH CONTRAST TECHNIQUE: Multidetector CT imaging of the chest was performed using the standard protocol during bolus administration of intravenous contrast. Multiplanar CT image reconstructions and MIPs were obtained to evaluate the vascular anatomy. CONTRAST:  100 cc Isovue 370.  COMPARISON:  CT angiogram of the chest 09/08/2014. PA and lateral chest earlier today. FINDINGS: No pulmonary embolus is identified. Heart size is normal. No pleural or pericardial effusion. No axillary, hilar or mediastinal lymphadenopathy. Coronary artery stents are noted. The lungs demonstrate extensive ground-glass attenuation which appears worse on the right, particularly in the superior segment of the right lower lobe. In the right upper lobe, some of these areas of ground-glass attenuation have a somewhat nodular appearance. Airways are unremarkable. Incidentally imaged upper abdomen is unremarkable. No focal bony abnormality is identified. Review of the MIP images confirms the above findings. IMPRESSION: Negative for pulmonary embolus. Extensive ground-glass attenuation appearing worse on the right could be due to asymmetric edema or infectious or inflammatory process including atypical infection. Electronically Signed   By: Inge Rise M.D.   On: 07/28/2016 15:46    Procedures Procedures (including critical care time)  Medications Ordered in ED Medications  LORazepam (ATIVAN) injection 1 mg (1 mg Intravenous Given 07/28/16 1503)  iopamidol (ISOVUE-370) 76 % injection 100 mL (100 mLs Intravenous Contrast Given 07/28/16 1524)     Initial Impression / Assessment and Plan / ED Course  I have reviewed the triage vital signs and the nursing notes.  Pertinent labs & imaging results that were available during my care of the patient were reviewed by me and considered in my medical decision making (see chart for details).  Clinical Course    The patient's oxygen is 100%, her pulse is 65 bpm. She has no peripheral edema and has been taking her clopidogrel religiously. This makes pulmonary embolism much less likely. Her EKG is nonischemic and her symptoms seem to be very intermittent and respiratory without chest pain after having a significant anxiety provoking argument with her boyfriend last  night. We'll obtain a troponin and a d-dimer, chest x-ray ordered. The patient will also be given a small amount of Ativan.  Ativan helped - dimer elevated CT shows some ground glass at bases - ? pna - BNP neg, trop neg, ECG neg, pt states has been doing more coughing  Home with augmentin - stable Pt informed of results Agreeable to plan.  Final Clinical Impressions(s) / ED Diagnoses   Final diagnoses:  CAP (community acquired pneumonia)  Shortness of breath    New Prescriptions New Prescriptions   AMOXICILLIN-CLAVULANATE (AUGMENTIN) 875-125 MG TABLET    Take 1 tablet by mouth every 12 (twelve) hours.     Noemi Chapel, MD 07/28/16 [phone]

## 2016-07-28 NOTE — ED Notes (Signed)
Patient states her ride is on the way.

## 2016-07-28 NOTE — ED Notes (Signed)
Pt calling her ride home now.

## 2016-07-28 NOTE — Discharge Instructions (Signed)

## 2016-08-01 ENCOUNTER — Emergency Department (HOSPITAL_BASED_OUTPATIENT_CLINIC_OR_DEPARTMENT_OTHER): Payer: Medicaid - Out of State

## 2016-08-01 ENCOUNTER — Emergency Department (HOSPITAL_BASED_OUTPATIENT_CLINIC_OR_DEPARTMENT_OTHER)
Admission: EM | Admit: 2016-08-01 | Discharge: 2016-08-01 | Disposition: A | Discharge status: Home / Self Care | Payer: Medicaid - Out of State | Attending: Emergency Medicine | Admitting: Emergency Medicine

## 2016-08-01 ENCOUNTER — Encounter (HOSPITAL_BASED_OUTPATIENT_CLINIC_OR_DEPARTMENT_OTHER): Payer: Self-pay | Admitting: *Deleted

## 2016-08-01 DIAGNOSIS — J189 Pneumonia, unspecified organism: Secondary | ICD-10-CM | POA: Insufficient documentation

## 2016-08-01 DIAGNOSIS — F17200 Nicotine dependence, unspecified, uncomplicated: Secondary | ICD-10-CM | POA: Insufficient documentation

## 2016-08-01 DIAGNOSIS — Z79899 Other long term (current) drug therapy: Secondary | ICD-10-CM | POA: Insufficient documentation

## 2016-08-01 DIAGNOSIS — Z7982 Long term (current) use of aspirin: Secondary | ICD-10-CM | POA: Insufficient documentation

## 2016-08-01 DIAGNOSIS — I10 Essential (primary) hypertension: Secondary | ICD-10-CM | POA: Insufficient documentation

## 2016-08-01 LAB — BASIC METABOLIC PANEL
Anion gap: 9 (ref 5–15)
BUN: 14 mg/dL (ref 6–20)
CALCIUM: 9.3 mg/dL (ref 8.9–10.3)
CHLORIDE: 104 mmol/L (ref 101–111)
CO2: 23 mmol/L (ref 22–32)
CREATININE: 0.69 mg/dL (ref 0.44–1.00)
GFR calc non Af Amer: >60 mL/min (ref >60)
GLUCOSE: 99 mg/dL (ref 65–99)
Potassium: 4 mmol/L (ref 3.5–5.1)
Sodium: 136 mmol/L (ref 135–145)

## 2016-08-01 LAB — CBC WITH DIFFERENTIAL/PLATELET
BASOS PCT: 0 %
Basophils Absolute: 0 10*3/uL (ref 0.0–0.1)
EOS ABS: 0.1 10*3/uL (ref 0.0–0.7)
EOS PCT: 1 %
HCT: 33.1 % — ABNORMAL LOW (ref 36.0–46.0)
HEMOGLOBIN: 11.1 g/dL — AB (ref 12.0–15.0)
LYMPHS ABS: 2.2 10*3/uL (ref 0.7–4.0)
Lymphocytes Relative: 34 %
MCH: 29.7 pg (ref 26.0–34.0)
MCHC: 33.5 g/dL (ref 30.0–36.0)
MCV: 88.5 fL (ref 78.0–100.0)
MONO ABS: 0.5 10*3/uL (ref 0.1–1.0)
MONOS PCT: 7 %
NEUTROS PCT: 58 %
Neutro Abs: 3.9 10*3/uL (ref 1.7–7.7)
PLATELETS: 309 10*3/uL (ref 150–400)
RBC: 3.74 MIL/uL — ABNORMAL LOW (ref 3.87–5.11)
RDW: 15.2 % (ref 11.5–15.5)
WBC: 6.7 10*3/uL (ref 4.0–10.5)

## 2016-08-01 LAB — TROPONIN I

## 2016-08-01 MED ORDER — ALBUTEROL SULFATE (2.5 MG/3ML) 0.083% IN NEBU
5.0000 mg | INHALATION_SOLUTION | Freq: Once | RESPIRATORY_TRACT | Status: AC
  Administered 2016-08-01: 5 mg via RESPIRATORY_TRACT
  Filled 2016-08-01: qty 6

## 2016-08-01 MED ORDER — ALBUTEROL SULFATE HFA 108 (90 BASE) MCG/ACT IN AERS
2.0000 | INHALATION_SPRAY | Freq: Once | RESPIRATORY_TRACT | Status: AC
  Administered 2016-08-01: 2 via RESPIRATORY_TRACT
  Filled 2016-08-01: qty 6.7

## 2016-08-01 MED ORDER — AZITHROMYCIN 250 MG PO TABS: 250.0000 mg | ORAL_TABLET | Freq: Every day | ORAL | 0 refills | Status: DC

## 2016-08-01 MED ORDER — IBUPROFEN 800 MG PO TABS
800.0000 mg | ORAL_TABLET | Freq: Once | ORAL | Status: AC
  Administered 2016-08-01: 800 mg via ORAL
  Filled 2016-08-01: qty 1

## 2016-08-01 MED FILL — AZITHROMYCIN 250 MG TABLET: 250 | 4 days supply | Qty: 4 | Fill #0

## 2016-08-01 NOTE — Discharge Instructions (Signed)
Zithromax as prescribed.  Albuterol inhaler: 2 puffs every 4 hours as needed for wheezing or difficulty breathing.  Return to the emergency department if your symptoms significantly worsen or change.

## 2016-08-01 NOTE — ED Provider Notes (Addendum)
[place] [facility] Provider Note   CSN: [id number] Arrival date & time: 08/01/16  1101     History   Chief Complaint Chief Complaint  Patient presents with  . Shortness of Breath    HPI Katie Pearson is a 35 y.o. female.  Patient is a 35 year old female with past medical history of MI with stents, , pulmonary embolism, and recently diagnosed pneumonia. She presents with complaints of tightness in her chest and productive cough for the past several days. She was seen here on September 3 and diagnosed with pneumonia after undergoing a chest x-ray and CT scan. Her scan was negative for pulmonary embolism, but did show bilateral infiltrates suggestive of pneumonia. She was prescribed Augmentin, however did not fill this because she could not afford it. She returns today feeling worse.   The history is provided by the patient.  Shortness of Breath  This is a new problem. The average episode lasts 3 days. The problem has been gradually worsening. Associated symptoms include cough, sputum production and chest pain. Pertinent negatives include no fever. She has tried nothing for the symptoms. Associated medical issues include pneumonia, PE and CAD. Associated medical issues do not include asthma or COPD.    Past Medical History:  Diagnosis Date  . Hypertension   . [place]   . Pulmonary emboli ([facility])     There are no active problems to display for this patient.   Past Surgical History:  Procedure Laterality Date  . CESAREAN SECTION    . LAMINECTOMY    . TUBAL LIGATION      OB History    No data available       Home Medications    Prior to Admission medications   Medication Sig Start Date End Date Taking? Authorizing Provider  amLODipine (NORVASC) 5 MG tablet Take 5 mg by mouth daily.    Historical Provider, MD  amoxicillin-clavulanate (AUGMENTIN) 875-125 MG tablet Take 1 tablet by mouth every 12 (twelve) hours. 07/28/16   Noemi Chapel, MD    Aspirin-Caffeine (BAYER BACK & BODY PAIN EX ST PO) Take 4 tablets by mouth 3 (three) times daily as needed (pain).    Historical Provider, MD  atorvastatin (LIPITOR) 20 MG tablet Take 20 mg by mouth daily.    Historical Provider, MD  cloNIDine (CATAPRES) 0.2 MG tablet Take 1 tablet (0.2 mg total) by mouth 2 (two) times daily. 09/08/14   Ezequiel Essex, MD  clopidogrel (PLAVIX) 75 MG tablet Take 75 mg by mouth daily.    Historical Provider, MD  cyclobenzaprine (FLEXERIL) 10 MG tablet Take 10 mg by mouth every 8 (eight) hours as needed for muscle spasms.    Historical Provider, MD  HYDROcodone-acetaminophen (NORCO/VICODIN) 5-325 MG per tablet Take 1 tablet by mouth every 6 (six) hours as needed for moderate pain.    Historical Provider, MD  ondansetron (ZOFRAN) 4 MG tablet Take 1 tablet (4 mg total) by mouth every 6 (six) hours. 09/08/14   Ezequiel Essex, MD    Family History No family history on file.  Social History Social History  Substance Use Topics  . Smoking status: Current Every Day Smoker  . Smokeless tobacco: Never Used  . Alcohol use No     Allergies   Ace inhibitors; Codeine; Latex; and Tape   Review of Systems Review of Systems  Constitutional: Negative for fever.  Respiratory: Positive for cough, sputum production and shortness of breath.   Cardiovascular: Positive for chest pain.  Physical Exam Updated Vital Signs BP (!) 164/110   Pulse 65   Temp 98 F (36.7 C) (Oral)   Resp (!) 40 Comment: she is hyperventilating at triage  LMP 07/18/2016   SpO2 100%   Physical Exam  Constitutional: She is oriented to person, place, and time. She appears well-developed and well-nourished. No distress.  HENT:  Head: Normocephalic and atraumatic.  Neck: Normal range of motion. Neck supple.  Cardiovascular: Normal rate and regular rhythm.  Exam reveals no gallop and no friction rub.   No murmur heard. Pulmonary/Chest: Effort normal and breath sounds normal. No  respiratory distress. She has no wheezes.  Abdominal: Soft. Bowel sounds are normal. She exhibits no distension. There is no tenderness.  Musculoskeletal: Normal range of motion.  Neurological: She is alert and oriented to person, place, and time.  Skin: Skin is warm and dry. She is not diaphoretic.  Nursing note and vitals reviewed.    ED Treatments / Results  Labs (all labs ordered are listed, but only abnormal results are displayed) Labs Reviewed  BASIC METABOLIC PANEL  CBC WITH DIFFERENTIAL/PLATELET  TROPONIN I    EKG  EKG Interpretation  Date/Time:  Thursday August 01 2016 11:22:00 EDT Ventricular Rate:  65 PR Interval:    QRS Duration: 89 QT Interval:  451 QTC Calculation: 469 R Axis:   20 Text Interpretation:  Sinus rhythm Probable left atrial enlargement Confirmed by Shaundra Fullam  MD, [place] ([id number]) on 08/01/2016 11:32:09 AM       Radiology No results found.  Procedures Procedures (including critical care time)  Medications Ordered in ED Medications - No data to display   Initial Impression / Assessment and Plan / ED Course  I have reviewed the triage vital signs and the nursing notes.  Pertinent labs & imaging results that were available during my care of the patient were reviewed by me and considered in my medical decision making (see chart for details).  Clinical Course    Chest x-ray is clear and white count is normal. EKG is unchanged and troponin is negative. Her oxygen saturations are 100% on room air and there is no tachycardia. I feel as though it is appropriate to attempt outpatient treatment of the infiltrates that were identified on her CT scan. She will be given a dose of Zithromax here and a prescription for the remainder of the course as she does not feel she can afford the Augmentin. She will also be given an albuterol inhaler and instructed on its use.  Final Clinical Impressions(s) / ED Diagnoses   Final diagnoses:  None    New  Prescriptions New Prescriptions   No medications on file     Veryl Speak, MD 08/01/16 [place], MD 09/26/16 1325

## 2016-08-01 NOTE — ED Triage Notes (Signed)
Sob. She was seen for same 4 days ago when she was diagnosed with pneumonia. She did not get her Rx filled.

## 2016-08-01 NOTE — ED Notes (Signed)
Attempt x 1 to start IV to left ac unsuccessful.

## 2016-10-29 ENCOUNTER — Encounter (HOSPITAL_BASED_OUTPATIENT_CLINIC_OR_DEPARTMENT_OTHER): Payer: Self-pay | Admitting: *Deleted

## 2016-10-29 ENCOUNTER — Emergency Department (HOSPITAL_BASED_OUTPATIENT_CLINIC_OR_DEPARTMENT_OTHER)
Admission: EM | Admit: 2016-10-29 | Discharge: 2016-10-29 | Disposition: A | Discharge status: Home / Self Care | Payer: Medicaid - Out of State | Attending: Emergency Medicine | Admitting: Emergency Medicine

## 2016-10-29 ENCOUNTER — Emergency Department (HOSPITAL_BASED_OUTPATIENT_CLINIC_OR_DEPARTMENT_OTHER): Payer: Medicaid - Out of State

## 2016-10-29 DIAGNOSIS — I10 Essential (primary) hypertension: Secondary | ICD-10-CM | POA: Insufficient documentation

## 2016-10-29 DIAGNOSIS — R079 Chest pain, unspecified: Secondary | ICD-10-CM

## 2016-10-29 DIAGNOSIS — Z87891 Personal history of nicotine dependence: Secondary | ICD-10-CM | POA: Insufficient documentation

## 2016-10-29 DIAGNOSIS — Z79899 Other long term (current) drug therapy: Secondary | ICD-10-CM | POA: Insufficient documentation

## 2016-10-29 DIAGNOSIS — I252 Old myocardial infarction: Secondary | ICD-10-CM | POA: Insufficient documentation

## 2016-10-29 DIAGNOSIS — R1013 Epigastric pain: Secondary | ICD-10-CM | POA: Insufficient documentation

## 2016-10-29 LAB — CBC
HCT: 35.6 % — ABNORMAL LOW (ref 36.0–46.0)
Hemoglobin: 12 g/dL (ref 12.0–15.0)
MCH: 30.2 pg (ref 26.0–34.0)
MCHC: 33.7 g/dL (ref 30.0–36.0)
MCV: 89.4 fL (ref 78.0–100.0)
PLATELETS: 285 10*3/uL (ref 150–400)
RBC: 3.98 MIL/uL (ref 3.87–5.11)
RDW: 14 % (ref 11.5–15.5)
WBC: 7.2 10*3/uL (ref 4.0–10.5)

## 2016-10-29 LAB — BASIC METABOLIC PANEL WITH GFR
Anion gap: 9 (ref 5–15)
BUN: 18 mg/dL (ref 6–20)
CO2: 25 mmol/L (ref 22–32)
Calcium: 9.6 mg/dL (ref 8.9–10.3)
Chloride: 104 mmol/L (ref 101–111)
Creatinine, Ser: 0.8 mg/dL (ref 0.44–1.00)
GFR calc Af Amer: >60 mL/min
GFR calc non Af Amer: >60 mL/min
Glucose, Bld: 124 mg/dL — ABNORMAL HIGH (ref 65–99)
Potassium: 3.6 mmol/L (ref 3.5–5.1)
Sodium: 138 mmol/L (ref 135–145)

## 2016-10-29 LAB — TROPONIN I

## 2016-10-29 MED ORDER — FAMOTIDINE 20 MG PO TABS: 20.0000 mg | ORAL_TABLET | Freq: Two times a day (BID) | ORAL | 0 refills | Status: DC

## 2016-10-29 MED ORDER — FAMOTIDINE 20 MG PO TABS
20.0000 mg | ORAL_TABLET | Freq: Once | ORAL | Status: AC
  Administered 2016-10-29: 20 mg via ORAL
  Filled 2016-10-29: qty 1

## 2016-10-29 NOTE — ED Notes (Signed)
Pt verbalized understanding of discharge instructions and denies any further questions at this time.   

## 2016-10-29 NOTE — ED Notes (Signed)
Vital signs stable. Pt comfortable.

## 2016-10-29 NOTE — ED Provider Notes (Signed)
[facility] Provider Note   CSN: [id number] Arrival date & time: 10/29/16  1132     History   Chief Complaint Chief Complaint  Patient presents with  . Chest Pain    HPI Katie Pearson is a 35 y.o. female.  HPI Patient reports that approximately 8:30 she got an intense pain that went from her epigastrium up into her chest and through to her back. She reports is stayed right in the middle and was like a deep severe ache. That lasted about 4 hours. Now that has resolved, she reports all symptoms are gone. She reports at the time she felt that she had a little bit of nausea, no shortness of breath no syncope. Patient had eaten Fried egg and toast at about 6 AM. Patient does have history of coronary artery disease with history of MI and pulmonary bolus. She reports she has been compliant with all of her medications. She reports this did not feel like a pulmonary embolus that she is experienced in the past. She reports she has tried to wait it out and thought maybe it was gas, but when it persisted she got concerned about possible heart related issues. Past Medical History:  Diagnosis Date  . Hypertension   . MI (myocardial infarction)   . Pulmonary emboli (HCC)     There are no active problems to display for this patient.   Past Surgical History:  Procedure Laterality Date  . CESAREAN SECTION    . CORONARY ANGIOPLASTY WITH STENT PLACEMENT    . LAMINECTOMY    . TUBAL LIGATION      OB History    No data available       Home Medications    Prior to Admission medications   Medication Sig Start Date End Date Taking? Authorizing Provider  amLODipine (NORVASC) 5 MG tablet Take 5 mg by mouth daily.   Yes Historical Provider, MD  Aspirin-Caffeine (BAYER BACK & BODY PAIN EX ST PO) Take 4 tablets by mouth 3 (three) times daily as needed (pain).   Yes Historical Provider, MD  atorvastatin (LIPITOR) 20 MG tablet Take 20 mg by mouth daily.   Yes Historical Provider, MD    carvedilol (COREG) 12.5 MG tablet Take 12.5 mg by mouth 2 (two) times daily with a meal.   Yes Historical Provider, MD  cyclobenzaprine (FLEXERIL) 10 MG tablet Take 10 mg by mouth every 8 (eight) hours as needed for muscle spasms.   Yes Historical Provider, MD  amoxicillin-clavulanate (AUGMENTIN) 875-125 MG tablet Take 1 tablet by mouth every 12 (twelve) hours. 07/28/16   Noemi Chapel, MD  azithromycin (ZITHROMAX) 250 MG tablet Take 1 tablet (250 mg total) by mouth daily. 08/01/16   Veryl Speak, MD  cloNIDine (CATAPRES) 0.2 MG tablet Take 1 tablet (0.2 mg total) by mouth 2 (two) times daily. 09/08/14   Ezequiel Essex, MD  clopidogrel (PLAVIX) 75 MG tablet Take 75 mg by mouth daily.    Historical Provider, MD  famotidine (PEPCID) 20 MG tablet Take 1 tablet (20 mg total) by mouth 2 (two) times daily. 10/29/16   Charlesetta Shanks, MD  HYDROcodone-acetaminophen (NORCO/VICODIN) 5-325 MG per tablet Take 1 tablet by mouth every 6 (six) hours as needed for moderate pain.    Historical Provider, MD  ondansetron (ZOFRAN) 4 MG tablet Take 1 tablet (4 mg total) by mouth every 6 (six) hours. 09/08/14   Ezequiel Essex, MD    Family History No family history on file.  Social History Social  History  Substance Use Topics  . Smoking status: Former Smoker    Quit date: 10/30/2015  . Smokeless tobacco: Never Used  . Alcohol use No     Allergies   Ace inhibitors; Codeine; Latex; and Tape   Review of Systems Review of Systems 10 Systems reviewed and are negative for acute change except as noted in the HPI.   Physical Exam Updated Vital Signs BP 147/84   Pulse (!) 59   Resp 22   LMP 10/10/2016   SpO2 100%   Physical Exam  Constitutional: She is oriented to person, place, and time. No distress.  Patient is alert and nontoxic. No respiratory distress. She is well in appearance. Morbid obesity.  HENT:  Head: Normocephalic and atraumatic.  Eyes: Conjunctivae are normal.  Neck: Neck supple.   Cardiovascular: Normal rate, regular rhythm, normal heart sounds and intact distal pulses.   No murmur heard. Pulmonary/Chest: Effort normal and breath sounds normal. No respiratory distress. She exhibits no tenderness.  Patient reports earlier, when she arrived and did tech was pressing on her chest like it hurt very badly. She reports that is resolved now and endorses no tenderness to my palpation.  Abdominal: Soft. There is no tenderness.  As I push into the patient's epigastrium she reports is exactly where the pain was coming from. She reports that is gone now. She is nontender.  Musculoskeletal: Normal range of motion. She exhibits no edema, tenderness or deformity.  Neurological: She is alert and oriented to person, place, and time. She exhibits normal muscle tone. Coordination normal.  Skin: Skin is warm and dry.  Psychiatric: She has a normal mood and affect.  Nursing note and vitals reviewed.    ED Treatments / Results  Labs (all labs ordered are listed, but only abnormal results are displayed) Labs Reviewed  BASIC METABOLIC PANEL - Abnormal; Notable for the following:       Result Value   Glucose, Bld 124 (*)    All other components within normal limits  CBC - Abnormal; Notable for the following:    HCT 35.6 (*)    All other components within normal limits  TROPONIN I  TROPONIN I    EKG  EKG Interpretation  Date/Time:  Tuesday October 29 2016 11:41:18 EST Ventricular Rate:  70 PR Interval:  150 QRS Duration: 80 QT Interval:  410 QTC Calculation: 442 R Axis:   39 Text Interpretation:  Normal sinus rhythm Normal ECG agree.no sig change from previous Confirmed by Johnney Killian, MD, Jeannie Done [phone]) on 10/29/2016 1:28:49 PM       Radiology Dg Chest 2 View  Result Date: 10/29/2016 CLINICAL DATA:  Mid chest pain for several hours. Sharp and radiates through to back. EXAM: CHEST  2 VIEW COMPARISON:  08/01/2016 FINDINGS: The heart size and mediastinal contours are within  normal limits. Both lungs are clear. The visualized skeletal structures are unremarkable. IMPRESSION: No active cardiopulmonary disease. Electronically Signed   By: Misty Stanley M.D.   On: 10/29/2016 12:00    Procedures Procedures (including critical care time)  Medications Ordered in ED Medications  famotidine (PEPCID) tablet 20 mg (20 mg Oral Given 10/29/16 1406)     Initial Impression / Assessment and Plan / ED Course  I have reviewed the triage vital signs and the nursing notes.  Pertinent labs & imaging results that were available during my care of the patient were reviewed by me and considered in my medical decision making (see chart for details).  Clinical Course     Final Clinical Impressions(s) / ED Diagnoses   Final diagnoses:  Chest pain, unspecified type  Patient felt a sudden chest pain that was epigastric in nature. She reports it radiated both up and her chest and down into her abdomen through to her back. It was a continuous pain for almost 4 hours. It then abated and resolved completely. Patient did not have associated symptoms but did feel like she might vomit but never did. At this time, symptoms are completely resolved. I have low suspicion for pulmonary embolus as etiology. Patient does have history of MI. Cardiac enzymes are negative 2 and EKG does not show acute changes. The quality of pain does not sound anginal. I suspect esophageal spasm. Patient will be started on twice daily Pepcid and is aware of signs and symptoms were to return. New Prescriptions New Prescriptions   FAMOTIDINE (PEPCID) 20 MG TABLET    Take 1 tablet (20 mg total) by mouth 2 (two) times daily.     Charlesetta Shanks, MD 10/29/16 [phone]

## 2016-10-29 NOTE — ED Triage Notes (Signed)
Pt reports mid-chest pain or several hours. States pain is sharp and goes through to her back. Pt has a hx of MI x 2 last year. Also reports she has been under stress due to death of fiancee last month

## 2017-01-01 ENCOUNTER — Emergency Department (HOSPITAL_BASED_OUTPATIENT_CLINIC_OR_DEPARTMENT_OTHER)
Admission: EM | Admit: 2017-01-01 | Discharge: 2017-01-01 | Disposition: A | Discharge status: Home / Self Care | Payer: Medicaid - Out of State | Attending: Emergency Medicine | Admitting: Emergency Medicine

## 2017-01-01 ENCOUNTER — Encounter (HOSPITAL_BASED_OUTPATIENT_CLINIC_OR_DEPARTMENT_OTHER): Payer: Self-pay

## 2017-01-01 DIAGNOSIS — Z79899 Other long term (current) drug therapy: Secondary | ICD-10-CM | POA: Insufficient documentation

## 2017-01-01 DIAGNOSIS — R05 Cough: Secondary | ICD-10-CM | POA: Insufficient documentation

## 2017-01-01 DIAGNOSIS — Z87891 Personal history of nicotine dependence: Secondary | ICD-10-CM | POA: Insufficient documentation

## 2017-01-01 DIAGNOSIS — I10 Essential (primary) hypertension: Secondary | ICD-10-CM | POA: Insufficient documentation

## 2017-01-01 DIAGNOSIS — Z7982 Long term (current) use of aspirin: Secondary | ICD-10-CM | POA: Insufficient documentation

## 2017-01-01 DIAGNOSIS — J111 Influenza due to unidentified influenza virus with other respiratory manifestations: Secondary | ICD-10-CM

## 2017-01-01 DIAGNOSIS — J029 Acute pharyngitis, unspecified: Secondary | ICD-10-CM | POA: Insufficient documentation

## 2017-01-01 DIAGNOSIS — Z760 Encounter for issue of repeat prescription: Secondary | ICD-10-CM

## 2017-01-01 DIAGNOSIS — R69 Illness, unspecified: Secondary | ICD-10-CM

## 2017-01-01 MED ORDER — AMLODIPINE BESYLATE 5 MG PO TABS: 5.0000 mg | ORAL_TABLET | Freq: Every day | ORAL | 0 refills | Status: DC

## 2017-01-01 MED ORDER — CYCLOBENZAPRINE HCL 10 MG PO TABS: 10.0000 mg | ORAL_TABLET | Freq: Three times a day (TID) | ORAL | 0 refills | Status: DC | PRN

## 2017-01-01 MED ORDER — BENZONATATE 100 MG PO CAPS: 200.0000 mg | ORAL_CAPSULE | Freq: Two times a day (BID) | ORAL | 0 refills | Status: DC | PRN

## 2017-01-01 MED ORDER — OSELTAMIVIR PHOSPHATE 75 MG PO CAPS: 75.0000 mg | ORAL_CAPSULE | Freq: Two times a day (BID) | ORAL | 0 refills | Status: DC

## 2017-01-01 MED ORDER — CARVEDILOL 12.5 MG PO TABS: 12.5000 mg | ORAL_TABLET | Freq: Two times a day (BID) | ORAL | 0 refills | Status: DC

## 2017-01-01 MED ORDER — ONDANSETRON HCL 4 MG PO TABS: 4.0000 mg | ORAL_TABLET | Freq: Four times a day (QID) | ORAL | 0 refills | Status: DC

## 2017-01-01 MED ORDER — ATORVASTATIN CALCIUM 20 MG PO TABS: 20.0000 mg | ORAL_TABLET | Freq: Every day | ORAL | 0 refills | Status: DC

## 2017-01-01 NOTE — ED Provider Notes (Signed)
[facility] Provider [facility]   CSN: [id number] Arrival date & time: 01/01/17  0847     History   Chief Complaint Chief Complaint  Patient presents with  . URI    HPI Katie Pearson is a 36 y.o. female.  Patient with past medical history of hypertension, MI, PE presents emergency department with chief complaint of flulike symptoms. She states that her symptoms started yesterday. She reports associated fevers, chills, cough, and body aches. She denies any nausea, vomiting, or diarrhea. She has not taken anything for her symptoms. She did not do flu shot this year. She states the symptoms started suddenly. She denies any chest pain or shortness of breath.  Additionally, patient states that she is out of her blood pressure medication. She inquires about a refill today.   The history is provided by the patient. No language interpreter was used.    Past Medical History:  Diagnosis Date  . Hypertension   . MI (myocardial infarction)   . Pulmonary emboli (HCC)     There are no active problems to display for this patient.   Past Surgical History:  Procedure Laterality Date  . CESAREAN SECTION    . CORONARY ANGIOPLASTY WITH STENT PLACEMENT    . LAMINECTOMY    . TUBAL LIGATION      OB History    No data available       Home Medications    Prior to Admission medications   Medication Sig Start Date End Date Taking? Authorizing Provider  amLODipine (NORVASC) 5 MG tablet Take 5 mg by mouth daily.    Historical Provider, MD  amoxicillin-clavulanate (AUGMENTIN) 875-125 MG tablet Take 1 tablet by mouth every 12 (twelve) hours. 07/28/16   Noemi Chapel, MD  Aspirin-Caffeine (BAYER BACK & BODY PAIN EX ST PO) Take 4 tablets by mouth 3 (three) times daily as needed (pain).    Historical Provider, MD  atorvastatin (LIPITOR) 20 MG tablet Take 20 mg by mouth daily.    Historical Provider, MD  azithromycin (ZITHROMAX) 250 MG tablet Take 1 tablet (250 mg total) by mouth daily.  08/01/16   Veryl Speak, MD  carvedilol (COREG) 12.5 MG tablet Take 12.5 mg by mouth 2 (two) times daily with a meal.    Historical Provider, MD  cloNIDine (CATAPRES) 0.2 MG tablet Take 1 tablet (0.2 mg total) by mouth 2 (two) times daily. 09/08/14   Ezequiel Essex, MD  clopidogrel (PLAVIX) 75 MG tablet Take 75 mg by mouth daily.    Historical Provider, MD  cyclobenzaprine (FLEXERIL) 10 MG tablet Take 10 mg by mouth every 8 (eight) hours as needed for muscle spasms.    Historical Provider, MD  famotidine (PEPCID) 20 MG tablet Take 1 tablet (20 mg total) by mouth 2 (two) times daily. 10/29/16   Charlesetta Shanks, MD  HYDROcodone-acetaminophen (NORCO/VICODIN) 5-325 MG per tablet Take 1 tablet by mouth every 6 (six) hours as needed for moderate pain.    Historical Provider, MD  ondansetron (ZOFRAN) 4 MG tablet Take 1 tablet (4 mg total) by mouth every 6 (six) hours. 09/08/14   Ezequiel Essex, MD    Family History No family history on file.  Social History Social History  Substance Use Topics  . Smoking status: Former Smoker    Quit date: 10/30/2015  . Smokeless tobacco: Never Used  . Alcohol use No     Allergies   Ace inhibitors; Codeine; Latex; and Tape   Review of Systems Review of Systems  Constitutional:  Positive for chills and fever.  HENT: Positive for sore throat.   Respiratory: Positive for cough.   Gastrointestinal: Negative for diarrhea, nausea and vomiting.  All other systems reviewed and are negative.    Physical Exam Updated Vital Signs BP (!) 167/114 (BP Location: Left Arm)   Pulse 81   Temp 98.2 F (36.8 C) (Oral)   Resp 18   Ht 5\' 5"  (1.651 m)   Wt 111.1 kg   LMP 01/01/2017   SpO2 100%   BMI 40.77 kg/m   Physical Exam Physical Exam  Constitutional: Pt  is oriented to person, place, and time. Appears well-developed and well-nourished. No distress.  HENT:  Head: Normocephalic and atraumatic.  Right Ear: Tympanic membrane, external ear and ear canal  normal.  Left Ear: Tympanic membrane, external ear and ear canal normal.  Nose: Mucosal edema and mild rhinorrhea present. No epistaxis. Right sinus exhibits no maxillary sinus tenderness and no frontal sinus tenderness. Left sinus exhibits no maxillary sinus tenderness and no frontal sinus tenderness.  Mouth/Throat: Uvula is midline and mucous membranes are normal. Mucous membranes are not pale and not cyanotic. No oropharyngeal exudate, posterior oropharyngeal edema, posterior oropharyngeal erythema or tonsillar abscesses.  Eyes: Conjunctivae are normal. Pupils are equal, round, and reactive to light.  Neck: Normal range of motion and full passive range of motion without pain.  Cardiovascular: Normal rate and intact distal pulses.   Pulmonary/Chest: Effort normal and breath sounds normal. No stridor.  Clear and equal breath sounds without focal wheezes, rhonchi, rales  Abdominal: Soft. Bowel sounds are normal. There is no tenderness.  Musculoskeletal: Normal range of motion.  Lymphadenopathy:    Pthas no cervical adenopathy.  Neurological: Pt is alert and oriented to person, place, and time.  Skin: Skin is warm and dry. No rash noted. Pt is not diaphoretic.  Psychiatric: Normal mood and affect.  Nursing note and vitals reviewed.    ED Treatments / Results  Labs (all labs ordered are listed, but only abnormal results are displayed) Labs Reviewed - No data to display  EKG  EKG Interpretation None       Radiology No results found.  Procedures Procedures (including critical care time)  Medications Ordered in ED Medications - No data to display   Initial Impression / Assessment and Plan / ED Course  I have reviewed the triage vital signs and the nursing notes.  Pertinent labs & imaging results that were available during my care of the patient were reviewed by me and considered in my medical decision making (see chart for details).     Patient symptoms are consistent with  influenza. She is within the treatment window, and would like to try Tamiflu. He has no chest pain or shortness of breath.  Lung sounds are clear.  She is not hypoxic nor tachycardic. I feel that she is stable for outpatient treatment. Specific return precautions discussed. Patient understands agrees the plan.  Courtesy refill of hypertensive medications.  Final Clinical Impressions(s) / ED Diagnoses   Final diagnoses:  Influenza-like illness  Medication refill    New Prescriptions New Prescriptions   BENZONATATE (TESSALON) 100 MG CAPSULE    Take 2 capsules (200 mg total) by mouth 2 (two) times daily as needed for cough.   ONDANSETRON (ZOFRAN) 4 MG TABLET    Take 1 tablet (4 mg total) by mouth every 6 (six) hours.   OSELTAMIVIR (TAMIFLU) 75 MG CAPSULE    Take 1 capsule (75 mg total) by mouth  every 12 (twelve) hours.     Montine Circle, PA-C 01/01/17 [place] Liu, MD 01/01/17 [phone]

## 2017-01-01 NOTE — ED Triage Notes (Signed)
Pre reports flu like illness that started yesterday. Pt reports productive cough, nasal congestion, HA, body aches. Fever reported at home TMax 101.   Pt also c/o HTN and has been out of medication x1 week. No PCP established.

## 2017-05-16 ENCOUNTER — Other Ambulatory Visit: Payer: Self-pay

## 2017-05-16 ENCOUNTER — Encounter (HOSPITAL_BASED_OUTPATIENT_CLINIC_OR_DEPARTMENT_OTHER): Payer: Self-pay

## 2017-05-16 ENCOUNTER — Emergency Department (HOSPITAL_BASED_OUTPATIENT_CLINIC_OR_DEPARTMENT_OTHER)
Admission: EM | Admit: 2017-05-16 | Discharge: 2017-05-16 | Disposition: A | Discharge status: Home / Self Care | Payer: Medicaid - Out of State | Attending: Physician Assistant | Admitting: Physician Assistant

## 2017-05-16 DIAGNOSIS — Z79899 Other long term (current) drug therapy: Secondary | ICD-10-CM | POA: Insufficient documentation

## 2017-05-16 DIAGNOSIS — K0889 Other specified disorders of teeth and supporting structures: Secondary | ICD-10-CM

## 2017-05-16 DIAGNOSIS — Z91040 Latex allergy status: Secondary | ICD-10-CM | POA: Insufficient documentation

## 2017-05-16 DIAGNOSIS — I10 Essential (primary) hypertension: Secondary | ICD-10-CM

## 2017-05-16 DIAGNOSIS — Z87891 Personal history of nicotine dependence: Secondary | ICD-10-CM | POA: Insufficient documentation

## 2017-05-16 DIAGNOSIS — Z7902 Long term (current) use of antithrombotics/antiplatelets: Secondary | ICD-10-CM | POA: Insufficient documentation

## 2017-05-16 DIAGNOSIS — Z7982 Long term (current) use of aspirin: Secondary | ICD-10-CM | POA: Insufficient documentation

## 2017-05-16 DIAGNOSIS — I252 Old myocardial infarction: Secondary | ICD-10-CM | POA: Insufficient documentation

## 2017-05-16 MED ORDER — ATORVASTATIN CALCIUM 20 MG PO TABS: 20.0000 mg | ORAL_TABLET | Freq: Every day | ORAL | 0 refills | Status: DC

## 2017-05-16 MED ORDER — PENICILLIN V POTASSIUM 500 MG PO TABS: 500.0000 mg | ORAL_TABLET | Freq: Three times a day (TID) | ORAL | 0 refills | Status: AC

## 2017-05-16 MED ORDER — AMLODIPINE BESYLATE 5 MG PO TABS
5.0000 mg | ORAL_TABLET | Freq: Once | ORAL | Status: AC
  Administered 2017-05-16: 5 mg via ORAL
  Filled 2017-05-16: qty 1

## 2017-05-16 MED ORDER — CARVEDILOL 12.5 MG PO TABS: 12.5000 mg | ORAL_TABLET | Freq: Two times a day (BID) | ORAL | 0 refills | Status: DC

## 2017-05-16 MED ORDER — PENICILLIN V POTASSIUM 250 MG PO TABS
500.0000 mg | ORAL_TABLET | Freq: Once | ORAL | Status: AC
  Administered 2017-05-16: 500 mg via ORAL
  Filled 2017-05-16: qty 2

## 2017-05-16 MED ORDER — ASPIRIN 81 MG PO CHEW
81.0000 mg | CHEWABLE_TABLET | Freq: Once | ORAL | Status: AC
  Administered 2017-05-16: 81 mg via ORAL
  Filled 2017-05-16: qty 1

## 2017-05-16 MED ORDER — AMLODIPINE BESYLATE 5 MG PO TABS: 5.0000 mg | ORAL_TABLET | Freq: Every day | ORAL | 0 refills | Status: DC

## 2017-05-16 MED ORDER — CARVEDILOL 12.5 MG PO TABS
12.5000 mg | ORAL_TABLET | Freq: Once | ORAL | Status: DC
  Filled 2017-05-16: qty 1

## 2017-05-16 MED FILL — PENICILLIN VK 500 MG TABLET: 500 | 10 days supply | Qty: 30 | Fill #0

## 2017-05-16 MED FILL — ATORVASTATIN 20 MG TABLET: 20 | 30 days supply | Qty: 30 | Fill #0

## 2017-05-16 MED FILL — AMLODIPINE BESYLATE 5 MG TA: 5 | 30 days supply | Qty: 30 | Fill #0

## 2017-05-16 MED FILL — CARVEDILOL 12.5 MG TABLET: 12.5 | 30 days supply | Qty: 60 | Fill #0

## 2017-05-16 NOTE — ED Provider Notes (Signed)
[facility] Provider Note   CSN: [id number] Arrival date & time: 05/16/17  1514     History   Chief Complaint Chief Complaint  Patient presents with  . Jaw Pain  . Hypertension    HPI Katie Pearson is a 36 y.o. female.  HPI  Patient is a 36 year old female with past mental history of MI, 7 stents, hypertension, pulmonary emboli. Patient is noncompliant with any medications at this time. Patient reports that she's not even taking aspirin that she is mostly taking. She's not taking any of her blood pressure medications. She is here because she's had pain in her left upper jaw. She reports she thinks it is an infection. She says it taste bad, and has swelling and pain. She is no chest pain or other symptoms.  Past Medical History:  Diagnosis Date  . Hypertension   . MI (myocardial infarction) ([facility])   . Pulmonary emboli (HCC)     There are no active problems to display for this patient.   Past Surgical History:  Procedure Laterality Date  . CESAREAN SECTION    . CORONARY ANGIOPLASTY WITH STENT PLACEMENT    . LAMINECTOMY    . TUBAL LIGATION      OB History    No data available       Home Medications    Prior to Admission medications   Medication Sig Start Date End Date Taking? Authorizing Provider  amLODipine (NORVASC) 5 MG tablet Take 1 tablet (5 mg total) by mouth daily. 01/01/17   Montine Circle, PA-C  amoxicillin-clavulanate (AUGMENTIN) 875-125 MG tablet Take 1 tablet by mouth every 12 (twelve) hours. 07/28/16   Noemi Chapel, MD  Aspirin-Caffeine (BAYER BACK & BODY PAIN EX ST PO) Take 4 tablets by mouth 3 (three) times daily as needed (pain).    [provider]  atorvastatin (LIPITOR) 20 MG tablet Take 1 tablet (20 mg total) by mouth daily. 01/01/17   Montine Circle, PA-C  azithromycin (ZITHROMAX) 250 MG tablet Take 1 tablet (250 mg total) by mouth daily. 08/01/16   Veryl Speak, MD  benzonatate (TESSALON) 100 MG capsule Take 2 capsules (200  mg total) by mouth 2 (two) times daily as needed for cough. 01/01/17   Montine Circle, PA-C  carvedilol (COREG) 12.5 MG tablet Take 1 tablet (12.5 mg total) by mouth 2 (two) times daily with a meal. 01/01/17   Montine Circle, PA-C  cloNIDine (CATAPRES) 0.2 MG tablet Take 1 tablet (0.2 mg total) by mouth 2 (two) times daily. 09/08/14   Rancour, Annie Main, MD  clopidogrel (PLAVIX) 75 MG tablet Take 75 mg by mouth daily.    [provider]  cyclobenzaprine (FLEXERIL) 10 MG tablet Take 1 tablet (10 mg total) by mouth every 8 (eight) hours as needed for muscle spasms. 01/01/17   Montine Circle, PA-C  famotidine (PEPCID) 20 MG tablet Take 1 tablet (20 mg total) by mouth 2 (two) times daily. 10/29/16   Charlesetta Shanks, MD  HYDROcodone-acetaminophen (NORCO/VICODIN) 5-325 MG per tablet Take 1 tablet by mouth every 6 (six) hours as needed for moderate pain.    [provider]  ondansetron (ZOFRAN) 4 MG tablet Take 1 tablet (4 mg total) by mouth every 6 (six) hours. 01/01/17   Montine Circle, PA-C  oseltamivir (TAMIFLU) 75 MG capsule Take 1 capsule (75 mg total) by mouth every 12 (twelve) hours. 01/01/17   Montine Circle, PA-C    Family History No family history on file.  Social History Social History  Substance Use Topics  . Smoking status: Former Smoker    Quit date: 10/30/2015  . Smokeless tobacco: Never Used  . Alcohol use No     Allergies   Ace inhibitors; Codeine; Latex; and Tape   Review of Systems Review of Systems  Constitutional: Negative for activity change.  Respiratory: Negative for cough, chest tightness and shortness of breath.   Cardiovascular: Negative for chest pain and palpitations.  Gastrointestinal: Negative for abdominal pain.  All other systems reviewed and are negative.    Physical Exam Updated Vital Signs BP (!) 171/91   Pulse 73   Temp 99.5 F (37.5 C) (Oral)   Resp (!) 23   LMP 04/20/2017   SpO2 99%   Physical Exam  Constitutional: She is  oriented to person, place, and time. She appears well-developed and well-nourished.  HENT:  Head: Normocephalic and atraumatic.  Broken tooth left upper jaw, no evidence of drainable abscess. Mild swelling externally. No evidence of deep space infection.  Eyes: Right eye exhibits no discharge.  Neck: Normal range of motion.  Cardiovascular: Normal rate and regular rhythm.   No murmur heard. Pulmonary/Chest: Effort normal and breath sounds normal. No respiratory distress. She has no wheezes.  Neurological: She is oriented to person, place, and time.  Skin: Skin is warm and dry. She is not diaphoretic.  Psychiatric: She has a normal mood and affect.  Nursing note and vitals reviewed.    ED Treatments / Results  Labs (all labs ordered are listed, but only abnormal results are displayed) Labs Reviewed - No data to display  EKG  EKG Interpretation  Date/Time:  Friday May 16 2017 15:28:25 EDT Ventricular Rate:  68 PR Interval:  150 QRS Duration: 82 QT Interval:  404 QTC Calculation: 429 R Axis:     Text Interpretation:  Normal sinus rhythm Minimal voltage criteria for LVH, may be normal variant Borderline ECG Normal sinus rhythm Confirmed by Thomasene Lot, [place] [phone]) on 05/16/2017 3:48:44 PM       Radiology No results found.  Procedures Procedures (including critical care time)  Medications Ordered in ED Medications - No data to display   Initial Impression / Assessment and Plan / ED Course  I have reviewed the triage vital signs and the nursing notes.  Pertinent labs & imaging results that were available during my care of the patient were reviewed by me and considered in my medical decision making (see chart for details).    Patient's 36 year old female presenting with dental infection. Patient promises to follow\ up with primary care physician. Patient denies any chest pain right now. Does not want further workup or labs currently. We will treat her with penicillins,  pain medication. We'll have her follow-up with dentistry. We'll write her for 1 month prescriptions of her antihypertensives.  Patient very strongly encouraged that she needs a follow-up with a primary care physician she needs to be on the right medications for her heart.   Final Clinical Impressions(s) / ED Diagnoses   Final diagnoses:  None    New Prescriptions New Prescriptions   No medications on file     Macarthur Critchley, MD 05/16/17 1642

## 2017-05-16 NOTE — ED Triage Notes (Signed)
C/o upper left toothache x 2 days-elevated BP with HA-noncompliant x 4 months

## 2017-05-16 NOTE — Discharge Instructions (Signed)
We really want your follow-up with primary care physician or cardiologist. We concerned about her high blood pressures and history of cardiac disease.  To find a primary care or specialty doctor please call [phone] or [phone] to access "[facility] a Doctor Service."  You may also go on the [facility] website at [email or web address]  There are also multiple [facility], [facility] and [facility] practices throughout the [place] that are frequently accepting new patients. You may find a clinic that is close to your home and contact them.  [facility] -  [address] [place] [address] [phone]  [facility] in [place] (also locations in [place] and [place]) -  [place] [address]  [place] [phone]

## 2017-05-16 NOTE — ED Notes (Addendum)
Pt placed on cardiac monitoring and auto VS

## 2017-05-16 NOTE — ED Triage Notes (Signed)
When questioned more after hx review and high BP -pt states toothache is left upper however she woke with pain to her left lower jaw-EKG to be done in triage

## 2017-08-03 ENCOUNTER — Emergency Department (HOSPITAL_BASED_OUTPATIENT_CLINIC_OR_DEPARTMENT_OTHER): Payer: Self-pay

## 2017-08-03 ENCOUNTER — Encounter (HOSPITAL_BASED_OUTPATIENT_CLINIC_OR_DEPARTMENT_OTHER): Payer: Self-pay | Admitting: *Deleted

## 2017-08-03 ENCOUNTER — Emergency Department (HOSPITAL_BASED_OUTPATIENT_CLINIC_OR_DEPARTMENT_OTHER)
Admission: EM | Admit: 2017-08-03 | Discharge: 2017-08-03 | Disposition: A | Discharge status: Home / Self Care | Payer: Self-pay | Attending: Emergency Medicine | Admitting: Emergency Medicine

## 2017-08-03 DIAGNOSIS — Z8673 Personal history of transient ischemic attack (TIA), and cerebral infarction without residual deficits: Secondary | ICD-10-CM | POA: Insufficient documentation

## 2017-08-03 DIAGNOSIS — Z91040 Latex allergy status: Secondary | ICD-10-CM | POA: Insufficient documentation

## 2017-08-03 DIAGNOSIS — J111 Influenza due to unidentified influenza virus with other respiratory manifestations: Secondary | ICD-10-CM

## 2017-08-03 DIAGNOSIS — R69 Illness, unspecified: Secondary | ICD-10-CM | POA: Insufficient documentation

## 2017-08-03 DIAGNOSIS — I10 Essential (primary) hypertension: Secondary | ICD-10-CM | POA: Insufficient documentation

## 2017-08-03 DIAGNOSIS — Z87891 Personal history of nicotine dependence: Secondary | ICD-10-CM | POA: Insufficient documentation

## 2017-08-03 DIAGNOSIS — Z79899 Other long term (current) drug therapy: Secondary | ICD-10-CM | POA: Insufficient documentation

## 2017-08-03 DIAGNOSIS — Z7902 Long term (current) use of antithrombotics/antiplatelets: Secondary | ICD-10-CM | POA: Insufficient documentation

## 2017-08-03 LAB — TROPONIN I: Troponin I: <0.03 ng/mL (ref <0.03)

## 2017-08-03 LAB — URINALYSIS, ROUTINE W REFLEX MICROSCOPIC
BILIRUBIN URINE: NEGATIVE
Glucose, UA: NEGATIVE mg/dL
HGB URINE DIPSTICK: NEGATIVE
Ketones, ur: NEGATIVE mg/dL
Leukocytes, UA: NEGATIVE
Nitrite: NEGATIVE
PROTEIN: NEGATIVE mg/dL
Specific Gravity, Urine: 1.02 (ref 1.005–1.030)
pH: 7.5 (ref 5.0–8.0)

## 2017-08-03 LAB — COMPREHENSIVE METABOLIC PANEL
ALK PHOS: 61 U/L (ref 38–126)
ALT: 14 U/L (ref 14–54)
ANION GAP: 10 (ref 5–15)
AST: 17 U/L (ref 15–41)
Albumin: 3.6 g/dL (ref 3.5–5.0)
BUN: 13 mg/dL (ref 6–20)
CALCIUM: 9.2 mg/dL (ref 8.9–10.3)
CO2: 20 mmol/L — AB (ref 22–32)
Chloride: 107 mmol/L (ref 101–111)
Creatinine, Ser: 0.71 mg/dL (ref 0.44–1.00)
Glucose, Bld: 106 mg/dL — ABNORMAL HIGH (ref 65–99)
Potassium: 3.8 mmol/L (ref 3.5–5.1)
SODIUM: 137 mmol/L (ref 135–145)
Total Bilirubin: 0.6 mg/dL (ref 0.3–1.2)
Total Protein: 8.4 g/dL — ABNORMAL HIGH (ref 6.5–8.1)

## 2017-08-03 LAB — CBC WITH DIFFERENTIAL/PLATELET
BASOS ABS: 0 10*3/uL (ref 0.0–0.1)
BASOS PCT: 0 %
EOS ABS: 0.1 10*3/uL (ref 0.0–0.7)
EOS PCT: 1 %
HCT: 35.4 % — ABNORMAL LOW (ref 36.0–46.0)
Hemoglobin: 11.9 g/dL — ABNORMAL LOW (ref 12.0–15.0)
Lymphocytes Relative: 13 %
Lymphs Abs: 1 10*3/uL (ref 0.7–4.0)
MCH: 30 pg (ref 26.0–34.0)
MCHC: 33.6 g/dL (ref 30.0–36.0)
MCV: 89.2 fL (ref 78.0–100.0)
MONO ABS: 0.6 10*3/uL (ref 0.1–1.0)
Monocytes Relative: 8 %
Neutro Abs: 5.8 10*3/uL (ref 1.7–7.7)
Neutrophils Relative %: 78 %
PLATELETS: 260 10*3/uL (ref 150–400)
RBC: 3.97 MIL/uL (ref 3.87–5.11)
RDW: 13.9 % (ref 11.5–15.5)
WBC: 7.5 10*3/uL (ref 4.0–10.5)

## 2017-08-03 LAB — PREGNANCY, URINE: PREG TEST UR: NEGATIVE

## 2017-08-03 MED ORDER — ATORVASTATIN CALCIUM 20 MG PO TABS: 20.0000 mg | ORAL_TABLET | Freq: Every day | ORAL | 0 refills | Status: DC

## 2017-08-03 MED ORDER — AMLODIPINE BESYLATE 5 MG PO TABS: 5.0000 mg | ORAL_TABLET | Freq: Every day | ORAL | 0 refills | Status: DC

## 2017-08-03 MED ORDER — ONDANSETRON HCL 4 MG/2ML IJ SOLN
4.0000 mg | Freq: Once | INTRAMUSCULAR | Status: AC
  Administered 2017-08-03: 4 mg via INTRAVENOUS
  Filled 2017-08-03: qty 2

## 2017-08-03 MED ORDER — CARVEDILOL 12.5 MG PO TABS: 12.5000 mg | ORAL_TABLET | Freq: Two times a day (BID) | ORAL | 0 refills | Status: DC

## 2017-08-03 MED ORDER — IBUPROFEN 400 MG PO TABS
600.0000 mg | ORAL_TABLET | Freq: Once | ORAL | Status: AC
  Administered 2017-08-03: 600 mg via ORAL
  Filled 2017-08-03: qty 1

## 2017-08-03 MED ORDER — CYCLOBENZAPRINE HCL 10 MG PO TABS: 10.0000 mg | ORAL_TABLET | Freq: Three times a day (TID) | ORAL | 0 refills | Status: DC | PRN

## 2017-08-03 MED ORDER — ACETAMINOPHEN 500 MG PO TABS
1000.0000 mg | ORAL_TABLET | Freq: Once | ORAL | Status: AC
  Administered 2017-08-03: 1000 mg via ORAL
  Filled 2017-08-03: qty 2

## 2017-08-03 MED ORDER — SODIUM CHLORIDE 0.9 % IV BOLUS (SEPSIS)
1000.0000 mL | Freq: Once | INTRAVENOUS | Status: AC
  Administered 2017-08-03: 1000 mL via INTRAVENOUS

## 2017-08-03 MED ORDER — CLONIDINE HCL 0.2 MG PO TABS: 0.2000 mg | ORAL_TABLET | Freq: Two times a day (BID) | ORAL | 0 refills | Status: DC

## 2017-08-03 NOTE — ED Triage Notes (Signed)
Pt states she woke up with a headache, chills and sweats and cough with chest pain. Also reports sob (pt able to speak in complete sentences: hx of MI and PE). Pt states she's been out of all her BP meds for a week.

## 2017-08-03 NOTE — ED Provider Notes (Signed)
[facility] Provider Note   CSN: [id number] Arrival date & time: 08/03/17  1019     History   Chief Complaint Chief Complaint  Patient presents with  . Headache  . Chest Pain    HPI Katie Pearson is a 36 y.o. female.  HPI 36 year old female who presents with cough, congestion, headache, fever and chills. She has a history of hypertension, prior PE not on anticoagulation, and prior MI. Reports last night with subjective fevers, chills, night sweats. This morning with cough productive of greenish-appearing sputum, congestion, runny nose, headache, muscle aches and cramping. States recently exposed to children with minimal URI symptoms, but not as severe as hers. Has not had nausea, vomiting, diarrhea, abdominal pain or urinary complaints. Not taking any medications for her symptoms. States that when she coughs there is a burning pain in her chest.   Past Medical History:  Diagnosis Date  . Hypertension   . MI (myocardial infarction) ([facility])   . Pulmonary emboli ([facility])     There are no active problems to display for this patient.   Past Surgical History:  Procedure Laterality Date  . CESAREAN SECTION    . CORONARY ANGIOPLASTY WITH STENT PLACEMENT    . LAMINECTOMY    . TUBAL LIGATION      OB History    No data available       Home Medications    Prior to Admission medications   Medication Sig Start Date End Date Taking? Authorizing Provider  Aspirin-Caffeine (BAYER BACK & BODY PAIN EX ST PO) Take 4 tablets by mouth 3 (three) times daily as needed (pain).   Yes [provider]  clopidogrel (PLAVIX) 75 MG tablet Take 75 mg by mouth daily.   Yes [provider]  amLODipine (NORVASC) 5 MG tablet Take 1 tablet (5 mg total) by mouth daily. 08/03/17 09/02/17  Forde Dandy, MD  atorvastatin (LIPITOR) 20 MG tablet Take 1 tablet (20 mg total) by mouth daily. 08/03/17 09/02/17  Forde Dandy, MD  carvedilol (COREG) 12.5 MG tablet Take 1 tablet (12.5 mg  total) by mouth 2 (two) times daily with a meal. 05/16/17   Mackuen, Courteney Lyn, MD  carvedilol (COREG) 12.5 MG tablet Take 1 tablet (12.5 mg total) by mouth 2 (two) times daily with a meal. 08/03/17   Forde Dandy, MD  cloNIDine (CATAPRES) 0.2 MG tablet Take 1 tablet (0.2 mg total) by mouth 2 (two) times daily. 09/08/14   Rancour, Annie Main, MD  cloNIDine (CATAPRES) 0.2 MG tablet Take 1 tablet (0.2 mg total) by mouth 2 (two) times daily. 08/03/17   Forde Dandy, MD  cyclobenzaprine (FLEXERIL) 10 MG tablet Take 1 tablet (10 mg total) by mouth every 8 (eight) hours as needed for muscle spasms. 01/01/17   Montine Circle, PA-C  cyclobenzaprine (FLEXERIL) 10 MG tablet Take 1 tablet (10 mg total) by mouth 3 (three) times daily as needed for muscle spasms. 08/03/17   Forde Dandy, MD    Family History No family history on file.  Social History Social History  Substance Use Topics  . Smoking status: Former Smoker    Quit date: 10/30/2015  . Smokeless tobacco: Never Used  . Alcohol use Yes     Comment: occ     Allergies   Ace inhibitors; Codeine; Latex; and Tape   Review of Systems Review of Systems  Constitutional: Positive for fatigue and fever.  Respiratory: Positive for cough and shortness of breath.   Cardiovascular:  Negative for leg swelling.  Gastrointestinal: Negative for diarrhea, nausea and vomiting.  All other systems reviewed and are negative.    Physical Exam Updated Vital Signs BP (!) 175/96   Pulse 83   Temp 99.7 F (37.6 C) (Oral)   Resp 17   Ht 5\' 5"  (1.651 m)   Wt 122.5 kg (270 lb)   LMP 07/13/2017 (Exact Date)   SpO2 97%   BMI 44.93 kg/m   Physical Exam Physical Exam  Nursing note and vitals reviewed. Constitutional: non-toxic, and in no acute distress Head: Normocephalic and atraumatic.  Mouth/Throat: Oropharynx is clear and moist.  Neck: Normal range of motion. Neck supple.  Cardiovascular: Normal rate and regular rhythm.   Pulmonary/Chest: Effort  normal and breath sounds coarse throughout.  Abdominal: Soft. There is no tenderness. There is no rebound and no guarding.  Musculoskeletal: Normal range of motion. no LE edema Neurological: Alert, no facial droop, fluent speech, moves all extremities symmetrically Skin: Skin is warm and dry.  Psychiatric: Cooperative   ED Treatments / Results  Labs (all labs ordered are listed, but only abnormal results are displayed) Labs Reviewed  CBC WITH DIFFERENTIAL/PLATELET - Abnormal; Notable for the following:       Result Value   Hemoglobin 11.9 (*)    HCT 35.4 (*)    All other components within normal limits  COMPREHENSIVE METABOLIC PANEL - Abnormal; Notable for the following:    CO2 20 (*)    Glucose, Bld 106 (*)    Total Protein 8.4 (*)    All other components within normal limits  TROPONIN I  PREGNANCY, URINE  URINALYSIS, ROUTINE W REFLEX MICROSCOPIC    EKG  EKG Interpretation  Date/Time:  Sunday August 03 2017 10:38:18 EDT Ventricular Rate:  84 PR Interval:    QRS Duration: 79 QT Interval:  372 QTC Calculation: 440 R Axis:   16 Text Interpretation:  Sinus rhythm no acute changes  Confirmed by Brantley Stage [phone]) on 08/03/2017 11:26:08 AM       Radiology Dg Chest 2 View  Result Date: 08/03/2017 CLINICAL DATA:  37 year-old female c/o cough and fever since this morning. Hx of 2 MI 2016, HTN, PE and several cardiac stents per pt EXAM: CHEST  2 VIEW COMPARISON:  Chest x-ray dated 10/29/2016. FINDINGS: The heart size and mediastinal contours are within normal limits. Both lungs are clear. The visualized skeletal structures are unremarkable. IMPRESSION: No active cardiopulmonary disease. Electronically Signed   By: Franki Cabot M.D.   On: 08/03/2017 11:47    Procedures Procedures (including critical care time)  Medications Ordered in ED Medications  acetaminophen (TYLENOL) tablet 1,000 mg (1,000 mg Oral Given 08/03/17 1109)  sodium chloride 0.9 % bolus 1,000 mL (0 mLs  Intravenous Stopped 08/03/17 1230)  ondansetron (ZOFRAN) injection 4 mg (4 mg Intravenous Given 08/03/17 1148)  ibuprofen (ADVIL,MOTRIN) tablet 600 mg (600 mg Oral Given 08/03/17 1242)     Initial Impression / Assessment and Plan / ED Course  I have reviewed the triage vital signs and the nursing notes.  Pertinent labs & imaging results that were available during my care of the patient were reviewed by me and considered in my medical decision making (see chart for details).     Presenting with fever, cough, congestion, runny nose but no body aches. Presentation concerning for likely flulike illness. Febrile here, but remainder of vitals stable. Given IVF and supportive care. Feels improved and looks well after. Blood work reassuring. CXR visualized  and shows no pneumonia or other acute cardiopulmonary processes. UA and pregnancy unremarkable.  Discussed supportive care management for likely flulike illness. Strict return and follow-up instructions reviewed. She expressed understanding of all discharge instructions and felt comfortable with the plan of care.   Final Clinical Impressions(s) / ED Diagnoses   Final diagnoses:  Influenza-like illness    New Prescriptions New Prescriptions   CARVEDILOL (COREG) 12.5 MG TABLET    Take 1 tablet (12.5 mg total) by mouth 2 (two) times daily with a meal.   CLONIDINE (CATAPRES) 0.2 MG TABLET    Take 1 tablet (0.2 mg total) by mouth 2 (two) times daily.   CYCLOBENZAPRINE (FLEXERIL) 10 MG TABLET    Take 1 tablet (10 mg total) by mouth 3 (three) times daily as needed for muscle spasms.     Forde Dandy, MD 08/03/17 1318

## 2017-08-03 NOTE — Discharge Instructions (Signed)
You have a flu-like illness. This will take 1-2 weeks to get better, but you will feel the worst the first 3-4 days.   Drink plenty of fluids. Get rest. Take tylenol and ibuprofen for fevers, aches, and pains.  Return without fail for worsening symptoms, including difficulty breathing, passing out, intractable vomiting, or any other symptoms concerning to you.

## 2017-08-03 NOTE — ED Notes (Signed)
Pt discharged to home with family. NAD.  

## 2017-08-03 NOTE — ED Notes (Signed)
ED Provider at bedside. 

## 2017-08-03 NOTE — ED Notes (Signed)
Delay in xray, RN starting IV

## 2017-08-03 NOTE — ED Notes (Signed)
Patient transported to X-ray 

## 2017-08-03 NOTE — ED Notes (Signed)
Unsuccessful IV attempt x1 -- infiltrated upon insertion.

## 2017-08-08 MED FILL — ATORVASTATIN 20 MG TABLET: 20 | 30 days supply | Qty: 30 | Fill #0

## 2017-08-08 MED FILL — CYCLOBENZAPRINE 10 MG TAB: 10 | 6 days supply | Qty: 20 | Fill #0

## 2017-08-08 MED FILL — AMLODIPINE BESYLATE 5 MG TA: 5 | 30 days supply | Qty: 30 | Fill #0

## 2017-08-08 MED FILL — CloNIDine HCL 0.2 MG TAB: 0.2 | 30 days supply | Qty: 60 | Fill #0

## 2017-08-08 MED FILL — CARVEDILOL 12.5 MG TABLET: 12.5 | 30 days supply | Qty: 60 | Fill #0

## 2018-01-21 ENCOUNTER — Other Ambulatory Visit: Payer: Self-pay

## 2018-01-21 ENCOUNTER — Encounter (HOSPITAL_BASED_OUTPATIENT_CLINIC_OR_DEPARTMENT_OTHER): Payer: Self-pay

## 2018-01-21 ENCOUNTER — Emergency Department (HOSPITAL_BASED_OUTPATIENT_CLINIC_OR_DEPARTMENT_OTHER)
Admission: EM | Admit: 2018-01-21 | Discharge: 2018-01-21 | Disposition: A | Discharge status: Home / Self Care | Payer: Medicaid - Out of State | Attending: Emergency Medicine | Admitting: Emergency Medicine

## 2018-01-21 DIAGNOSIS — I10 Essential (primary) hypertension: Secondary | ICD-10-CM | POA: Insufficient documentation

## 2018-01-21 DIAGNOSIS — M5432 Sciatica, left side: Secondary | ICD-10-CM | POA: Insufficient documentation

## 2018-01-21 DIAGNOSIS — I252 Old myocardial infarction: Secondary | ICD-10-CM | POA: Insufficient documentation

## 2018-01-21 DIAGNOSIS — Z91040 Latex allergy status: Secondary | ICD-10-CM | POA: Insufficient documentation

## 2018-01-21 DIAGNOSIS — Z87891 Personal history of nicotine dependence: Secondary | ICD-10-CM | POA: Insufficient documentation

## 2018-01-21 HISTORY — DX: Dorsalgia, unspecified: M54.9

## 2018-01-21 MED ORDER — CARVEDILOL PHOSPHATE ER 20 MG PO CP24
20.0000 mg | ORAL_CAPSULE | Freq: Every day | ORAL | Status: DC
  Filled 2018-01-21: qty 1

## 2018-01-21 MED ORDER — ATORVASTATIN CALCIUM 10 MG PO TABS: 10.0000 mg | ORAL_TABLET | Freq: Every day | ORAL | 0 refills | Status: DC

## 2018-01-21 MED ORDER — CYCLOBENZAPRINE HCL 10 MG PO TABS: 10.0000 mg | ORAL_TABLET | Freq: Two times a day (BID) | ORAL | 0 refills | Status: DC | PRN

## 2018-01-21 MED ORDER — CARVEDILOL PHOSPHATE ER 20 MG PO CP24: 20.0000 mg | ORAL_CAPSULE | Freq: Every day | ORAL | 0 refills | Status: DC

## 2018-01-21 MED ORDER — AMLODIPINE BESYLATE 10 MG PO TABS: 10.0000 mg | ORAL_TABLET | Freq: Every day | ORAL | 0 refills | Status: DC

## 2018-01-21 MED ORDER — AMLODIPINE BESYLATE 5 MG PO TABS
10.0000 mg | ORAL_TABLET | Freq: Once | ORAL | Status: AC
  Administered 2018-01-21: 10 mg via ORAL
  Filled 2018-01-21: qty 2

## 2018-01-21 MED FILL — CYCLOBENZAPRINE HCL 10 MG T: 10 | 10 days supply | Qty: 20 | Fill #0

## 2018-01-21 MED FILL — COREG CR 20 MG CAPSULE: 20 | 30 days supply | Qty: 30 | Fill #0

## 2018-01-21 MED FILL — ATORVASTATIN 10 MG TABLET: 10 | 30 days supply | Qty: 30 | Fill #0

## 2018-01-21 MED FILL — AMLODIPINE BESYLATE 10 MG T: 10 | 30 days supply | Qty: 30 | Fill #0

## 2018-01-21 NOTE — ED Provider Notes (Signed)
Emergency Department Provider Note   I have reviewed the triage vital signs and the nursing notes.   HISTORY  Chief Complaint Back Pain   HPI Katie Pearson is a 37 y.o. female with PMH of HTN, AMI, chronic back pain, and HLD presents to the ED with left buttock pain radiating down the left leg for the last 4 days. Patient has had similar pain in the past including remote history of laminectomy. No new injury to the back. Patient denies specific midline back pain but is having pain in the left buttock radiating down the leg. No bowel/bladder symptoms. No fever. No numbness, tingling, and weakness. Slight limping gait.    Past Medical History:  Diagnosis Date  . Back pain   . Hypertension   . MI (myocardial infarction) ([facility])   . Pulmonary emboli ([facility])     There are no active problems to display for this patient.   Past Surgical History:  Procedure Laterality Date  . CESAREAN SECTION    . CORONARY ANGIOPLASTY WITH STENT PLACEMENT    . LAMINECTOMY    . TUBAL LIGATION        Allergies Ace inhibitors; Codeine; Latex; and Tape  No family history on file.  Social History Social History   Tobacco Use  . Smoking status: Former Smoker    Last attempt to quit: 10/30/2015    Years since quitting: 2.2  . Smokeless tobacco: Never Used  Substance Use Topics  . Alcohol use: Yes    Comment: occ  . Drug use: No    Review of Systems  Constitutional: No fever/chills Eyes: No visual changes. ENT: No sore throat. Cardiovascular: Denies chest pain. Respiratory: Denies shortness of breath. Gastrointestinal: No abdominal pain.  No nausea, no vomiting.  No diarrhea.  No constipation. Genitourinary: Negative for dysuria. Musculoskeletal: Positive for back pain. Skin: Negative for rash. Neurological: Negative for headaches, focal weakness or numbness.  10-point ROS otherwise negative.  ____________________________________________   PHYSICAL EXAM:  VITAL SIGNS: ED  Triage Vitals  Enc Vitals Group     BP 01/21/18 1409 (!) 219/119     Pulse Rate 01/21/18 1409 82     Resp 01/21/18 1409 20     Temp 01/21/18 1409 98.3 F (36.8 C)     Temp Source 01/21/18 1409 Oral     SpO2 01/21/18 1409 100 %     Weight 01/21/18 1408 272 lb (123.4 kg)     Height 01/21/18 1408 5\' 5"  (1.651 m)     Pain Score 01/21/18 1406 10   Constitutional: Alert and oriented. Well appearing and in no acute distress. Eyes: Conjunctivae are normal. Head: Atraumatic. Nose: No congestion/rhinnorhea. Mouth/Throat: Mucous membranes are moist.  Oropharynx non-erythematous. Neck: No stridor. Cardiovascular: Normal rate, regular rhythm. Good peripheral circulation. Grossly normal heart sounds.   Respiratory: Normal respiratory effort.  No retractions. Lungs CTAB. Gastrointestinal: No distention.  Musculoskeletal: No lower extremity tenderness nor edema. No gross deformities of extremities. Neurologic:  Normal speech and language. No gross focal neurologic deficits are appreciated. Normal strength and sensation in the LEs. 2+ patellar reflexes. Gait with slight limp but otherwise normal.  Skin:  Skin is warm, dry and intact. No rash noted.  ____________________________________________  RADIOLOGY  None ____________________________________________   PROCEDURES  Procedure(s) performed:   Procedures  None  ____________________________________________   INITIAL IMPRESSION / ASSESSMENT AND PLAN / ED COURSE  Pertinent labs & imaging results that were available during my care of the patient were reviewed by  me and considered in my medical decision making (see chart for details).  Patient presents to the ED with acute on chronic back pain. Exam normal. Clinically consistent with sciatica. No evidence on exam to suggest spinal cord emergency.   Differential diagnosis includes but is not exclusive to musculoskeletal back pain, renal colic, urinary tract infection, pyelonephritis,  intra-abdominal causes of back pain, aortic aneurysm or dissection, cauda equina syndrome, sciatica, lumbar disc disease, thoracic disc disease, etc.  No indication for additional labs and testing. Patient also with significant HTN and has been off meds for some time. Will re-fill them and refer to PCP. No evidence of HTN emergency. Flexeril Rx for back pain.   At this time, I do not feel there is any life-threatening condition present. I have reviewed and discussed all results (EKG, imaging, lab, urine as appropriate), exam findings with patient. I have reviewed nursing notes and appropriate previous records.  I feel the patient is safe to be discharged home without further emergent workup. Discussed usual and customary return precautions. Patient and family (if present) verbalize understanding and are comfortable with this plan.  Patient will follow-up with their primary care provider. If they do not have a primary care provider, information for follow-up has been provided to them. All questions have been answered.  ____________________________________________  FINAL CLINICAL IMPRESSION(S) / ED DIAGNOSES  Final diagnoses:  Sciatica of left side  Essential hypertension     MEDICATIONS GIVEN DURING THIS VISIT:  Medications  amLODipine (NORVASC) tablet 10 mg (10 mg Oral Given 01/21/18 1536)     NEW OUTPATIENT MEDICATIONS STARTED DURING THIS VISIT:  Discharge Medication List as of 01/21/2018  3:30 PM    START taking these medications   Details  amLODipine (NORVASC) 10 MG tablet Take 1 tablet (10 mg total) by mouth daily., Starting Wed 01/21/2018, Until Fri 02/20/2018, Print    carvedilol (COREG CR) 20 MG 24 hr capsule Take 1 capsule (20 mg total) by mouth daily., Starting Wed 01/21/2018, Until Fri 02/20/2018, Print    cyclobenzaprine (FLEXERIL) 10 MG tablet Take 1 tablet (10 mg total) by mouth 2 (two) times daily as needed for muscle spasms., Starting Wed 01/21/2018, Print        Note:   This document was prepared using Dragon voice recognition software and may include unintentional dictation errors.  Katie Quinton, MD Emergency Medicine    Katie Pearson, Katie Olds, MD 01/22/18 [phone]

## 2018-01-21 NOTE — Discharge Instructions (Signed)

## 2018-01-21 NOTE — ED Triage Notes (Signed)
C/o lower back pain radiates down left leg-chronic pain but worse x 4 days-NAD-limping gait

## 2018-09-10 DIAGNOSIS — Z87891 Personal history of nicotine dependence: Secondary | ICD-10-CM | POA: Insufficient documentation

## 2018-09-10 DIAGNOSIS — K0889 Other specified disorders of teeth and supporting structures: Secondary | ICD-10-CM | POA: Insufficient documentation

## 2018-09-10 DIAGNOSIS — I10 Essential (primary) hypertension: Secondary | ICD-10-CM | POA: Insufficient documentation

## 2018-09-10 DIAGNOSIS — K029 Dental caries, unspecified: Secondary | ICD-10-CM | POA: Insufficient documentation

## 2018-09-11 ENCOUNTER — Other Ambulatory Visit: Payer: Self-pay

## 2018-09-11 ENCOUNTER — Emergency Department (HOSPITAL_BASED_OUTPATIENT_CLINIC_OR_DEPARTMENT_OTHER)
Admission: EM | Admit: 2018-09-11 | Discharge: 2018-09-11 | Disposition: A | Discharge status: Home / Self Care | Payer: Medicaid - Out of State | Attending: Emergency Medicine | Admitting: Emergency Medicine

## 2018-09-11 ENCOUNTER — Encounter (HOSPITAL_BASED_OUTPATIENT_CLINIC_OR_DEPARTMENT_OTHER): Payer: Self-pay

## 2018-09-11 DIAGNOSIS — K029 Dental caries, unspecified: Secondary | ICD-10-CM

## 2018-09-11 DIAGNOSIS — I10 Essential (primary) hypertension: Secondary | ICD-10-CM

## 2018-09-11 MED ORDER — ATORVASTATIN CALCIUM 10 MG PO TABS: 10.0000 mg | ORAL_TABLET | Freq: Every day | ORAL | 0 refills | Status: DC

## 2018-09-11 MED ORDER — BUPIVACAINE-EPINEPHRINE (PF) 0.5% -1:200000 IJ SOLN
1.8000 mL | Freq: Once | INTRAMUSCULAR | Status: AC
  Administered 2018-09-11: 1.8 mL

## 2018-09-11 MED ORDER — PENICILLIN V POTASSIUM 500 MG PO TABS: 500.0000 mg | ORAL_TABLET | Freq: Four times a day (QID) | ORAL | 0 refills | Status: AC

## 2018-09-11 MED ORDER — AMLODIPINE BESYLATE 10 MG PO TABS: 10.0000 mg | ORAL_TABLET | Freq: Every day | ORAL | 0 refills | Status: DC

## 2018-09-11 MED ORDER — CARVEDILOL PHOSPHATE ER 20 MG PO CP24: 20.0000 mg | ORAL_CAPSULE | Freq: Every day | ORAL | 0 refills | Status: DC

## 2018-09-11 MED ORDER — BUPIVACAINE-EPINEPHRINE (PF) 0.5% -1:200000 IJ SOLN
INTRAMUSCULAR | Status: AC
  Administered 2018-09-11: 1.8 mL
  Filled 2018-09-11: qty 1.8

## 2018-09-11 MED ORDER — PENICILLIN V POTASSIUM 250 MG PO TABS
500.0000 mg | ORAL_TABLET | Freq: Once | ORAL | Status: AC
  Administered 2018-09-11: 500 mg via ORAL
  Filled 2018-09-11: qty 2

## 2018-09-11 MED ORDER — PENICILLIN V POTASSIUM 500 MG PO TABS: 500.0000 mg | ORAL_TABLET | Freq: Four times a day (QID) | ORAL | 0 refills | Status: DC

## 2018-09-11 MED ORDER — HYDROCODONE-ACETAMINOPHEN 5-325 MG PO TABS: 1.0000 | ORAL_TABLET | Freq: Four times a day (QID) | ORAL | 0 refills | Status: DC | PRN

## 2018-09-11 NOTE — ED Provider Notes (Signed)
[facility] Provider Note: Georgena Spurling, MD, FACEP  CSN: [id number] MRN: [id number] ARRIVAL: 09/10/18 at 2353 ROOM: [facility]   HISTORY OF PRESENT ILLNESS  09/11/18 2:47 AM Katie Pearson is a 37 y.o. female with left upper second premolar carious to or fractured at the gumline.  This became painful yesterday and the pain is now severe.  She rates it as a 9 out of 10, throbbing, constant and it radiates from under her nose to the top of her jaw.  She has taken ibuprofen without relief.   Past Medical History:  Diagnosis Date  . Back pain   . Hypertension   . MI (myocardial infarction) ([facility])   . Pulmonary emboli [facility])     Past Surgical History:  Procedure Laterality Date  . CESAREAN SECTION    . CORONARY ANGIOPLASTY WITH STENT PLACEMENT    . LAMINECTOMY    . TUBAL LIGATION      History reviewed. No pertinent family history.  Social History   Tobacco Use  . Smoking status: Current Some Day Smoker    Packs/day: 0.25    Last attempt to quit: 10/30/2015    Years since quitting: 2.8  . Smokeless tobacco: Never Used  Substance Use Topics  . Alcohol use: Not Currently    Comment: occ  . Drug use: No    Prior to Admission medications   Medication Sig Start Date End Date Taking? Authorizing Provider  amLODipine (NORVASC) 10 MG tablet Take 1 tablet (10 mg total) by mouth daily. Patient not taking: Reported on 09/11/2018 01/21/18 02/20/18  Margette Fast, MD  atorvastatin (LIPITOR) 10 MG tablet Take 1 tablet (10 mg total) by mouth daily. Patient not taking: Reported on 09/11/2018 01/21/18 02/20/18  Margette Fast, MD  carvedilol (COREG CR) 20 MG 24 hr capsule Take 1 capsule (20 mg total) by mouth daily. Patient not taking: Reported on 09/11/2018 01/21/18 02/20/18  Long, Wonda Olds, MD  cyclobenzaprine (FLEXERIL) 10 MG tablet Take 1 tablet (10 mg total) by mouth 2 (two) times daily as needed for muscle spasms. 01/21/18   Long, Wonda Olds,  MD    Allergies Ace inhibitors; Codeine; Latex; and Tape   REVIEW OF SYSTEMS  Negative except as noted here or in the History of Present Illness.   PHYSICAL EXAMINATION  Initial Vital Signs Blood pressure (!) 162/102, pulse 67, temperature 98.2 F (36.8 C), temperature source Oral, resp. rate 16, height 5\' 6"  (1.676 m), weight 117.9 kg, last menstrual period 08/24/2018, SpO2 100 %.  Examination General: Well-developed, well-nourished female in no acute distress; appearance consistent with age of record HENT: normocephalic; atraumatic; left upper second premolar carious to gumline with tenderness on percussion of adjacent gum Eyes: pupils equal, round and reactive to light; extraocular muscles intact Neck: supple; no lymphadenopathy Heart: regular rate and rhythm Lungs: clear to auscultation bilaterally Abdomen: soft; nondistended; nontender; bowel sounds present Extremities: No deformity; full range of motion; pulses normal Neurologic: Awake, alert and oriented; motor function intact in all extremities and symmetric; no facial droop Skin: Warm and dry Psychiatric: Flat affect   RESULTS  Summary of this visit's results, reviewed by myself:   EKG Interpretation  Date/Time:    Ventricular Rate:    PR Interval:    QRS Duration:   QT Interval:    QTC Calculation:   R Axis:     Text Interpretation:        Laboratory Studies: No  results found for this or any previous visit (from the past 24 hour(s)). Imaging Studies: No results found.  ED COURSE and MDM  Nursing notes and initial vitals signs, including pulse oximetry, reviewed.  Vitals:   09/11/18 0005 09/11/18 0010 09/11/18 0050 09/11/18 0243  BP: (S) (!) 226/136  (!) 162/102   Pulse: 70  64 67  Resp: 16  18 16   Temp: 98.2 F (36.8 C)     TempSrc: Oral     SpO2: 100%  100% 100%  Weight:  117.9 kg    Height:  5\' 6"  (1.676 m)     Patient advised she will need to find a dentist for definitive care as we have  not on-call.  She acknowledges she has not taken her antihypertensives since February of this year when she got them refilled in the emergency department.  Although Epic lists Medicaid as her insurance she states she has no Medicaid or insurance.  Consultation with the [facility] state controlled substances database reveals the patient has received no opioid prescriptions in the past 2 years.   PROCEDURES   DENTAL BLOCK 1.8 milliliters of 0.5% bupivacaine with epinephrine were injected into the buccal fold adjacent to the left upper second premolar. The patient tolerated this well and there were no immediate complications. Adequate analgesia was obtained.  ED DIAGNOSES     ICD-10-CM   1. Pain due to dental caries K02.9   2. Hypertension not at goal I10        Burwell Bethel, Jenny Reichmann, MD 09/11/18 2671

## 2018-09-11 NOTE — ED Triage Notes (Signed)
Pt c/o dental pain x 1 day. Left side, maxilla. States that it "goes from under her nose to the top of her jaw". 9/10. Throbbing, constant. Tried ibuprofen, did not work. Poor dentition noted around painful site.

## 2018-09-11 NOTE — ED Notes (Signed)
EMT attempted 3 times for a blood pressure with two different cuffs and could not get a reading. RN made aware. Pt also complained that she did not get bp meds for her high bp.

## 2019-05-19 ENCOUNTER — Emergency Department (HOSPITAL_BASED_OUTPATIENT_CLINIC_OR_DEPARTMENT_OTHER): Payer: Medicaid Other

## 2019-05-19 ENCOUNTER — Encounter (HOSPITAL_BASED_OUTPATIENT_CLINIC_OR_DEPARTMENT_OTHER): Payer: Self-pay | Admitting: Emergency Medicine

## 2019-05-19 ENCOUNTER — Other Ambulatory Visit: Payer: Self-pay

## 2019-05-19 ENCOUNTER — Emergency Department (HOSPITAL_BASED_OUTPATIENT_CLINIC_OR_DEPARTMENT_OTHER)
Admission: EM | Admit: 2019-05-19 | Discharge: 2019-05-19 | Disposition: A | Discharge status: Home / Self Care | Payer: Medicaid Other | Attending: Emergency Medicine | Admitting: Emergency Medicine

## 2019-05-19 DIAGNOSIS — R102 Pelvic and perineal pain: Secondary | ICD-10-CM | POA: Diagnosis not present

## 2019-05-19 DIAGNOSIS — R110 Nausea: Secondary | ICD-10-CM | POA: Insufficient documentation

## 2019-05-19 DIAGNOSIS — Z79899 Other long term (current) drug therapy: Secondary | ICD-10-CM | POA: Insufficient documentation

## 2019-05-19 DIAGNOSIS — I10 Essential (primary) hypertension: Secondary | ICD-10-CM | POA: Insufficient documentation

## 2019-05-19 DIAGNOSIS — Z91040 Latex allergy status: Secondary | ICD-10-CM | POA: Diagnosis not present

## 2019-05-19 DIAGNOSIS — R5383 Other fatigue: Secondary | ICD-10-CM | POA: Insufficient documentation

## 2019-05-19 DIAGNOSIS — I252 Old myocardial infarction: Secondary | ICD-10-CM | POA: Insufficient documentation

## 2019-05-19 DIAGNOSIS — Z87891 Personal history of nicotine dependence: Secondary | ICD-10-CM | POA: Diagnosis not present

## 2019-05-19 HISTORY — DX: Obesity, unspecified: E66.9

## 2019-05-19 LAB — URINALYSIS, ROUTINE W REFLEX MICROSCOPIC
Bilirubin Urine: NEGATIVE
Glucose, UA: NEGATIVE mg/dL
Ketones, ur: NEGATIVE mg/dL
Nitrite: NEGATIVE
Protein, ur: 100 mg/dL — AB
Specific Gravity, Urine: <1.005 — ABNORMAL LOW (ref 1.005–1.030)
pH: 6 (ref 5.0–8.0)

## 2019-05-19 LAB — URINALYSIS, MICROSCOPIC (REFLEX): RBC / HPF: >50 RBC/hpf (ref 0–5)

## 2019-05-19 LAB — TROPONIN I (HIGH SENSITIVITY): Troponin I (High Sensitivity): 5 ng/L (ref <18)

## 2019-05-19 LAB — COMPREHENSIVE METABOLIC PANEL
ALT: 23 U/L (ref 0–44)
AST: 16 U/L (ref 15–41)
Albumin: 3.6 g/dL (ref 3.5–5.0)
Alkaline Phosphatase: 62 U/L (ref 38–126)
Anion gap: 10 (ref 5–15)
BUN: 15 mg/dL (ref 6–20)
CO2: 23 mmol/L (ref 22–32)
Calcium: 8.9 mg/dL (ref 8.9–10.3)
Chloride: 105 mmol/L (ref 98–111)
Creatinine, Ser: 0.87 mg/dL (ref 0.44–1.00)
GFR calc Af Amer: >60 mL/min (ref >60)
GFR calc non Af Amer: >60 mL/min (ref >60)
Glucose, Bld: 128 mg/dL — ABNORMAL HIGH (ref 70–99)
Potassium: 3.4 mmol/L — ABNORMAL LOW (ref 3.5–5.1)
Sodium: 138 mmol/L (ref 135–145)
Total Bilirubin: 0.6 mg/dL (ref 0.3–1.2)
Total Protein: 7.7 g/dL (ref 6.5–8.1)

## 2019-05-19 LAB — CBC WITH DIFFERENTIAL/PLATELET
Abs Immature Granulocytes: 0.02 10*3/uL (ref 0.00–0.07)
Basophils Absolute: 0 10*3/uL (ref 0.0–0.1)
Basophils Relative: 0 %
Eosinophils Absolute: 0.1 10*3/uL (ref 0.0–0.5)
Eosinophils Relative: 1 %
HCT: 36.1 % (ref 36.0–46.0)
Hemoglobin: 11.6 g/dL — ABNORMAL LOW (ref 12.0–15.0)
Immature Granulocytes: 0 %
Lymphocytes Relative: 31 %
Lymphs Abs: 2.6 10*3/uL (ref 0.7–4.0)
MCH: 30.1 pg (ref 26.0–34.0)
MCHC: 32.1 g/dL (ref 30.0–36.0)
MCV: 93.5 fL (ref 80.0–100.0)
Monocytes Absolute: 0.6 10*3/uL (ref 0.1–1.0)
Monocytes Relative: 7 %
Neutro Abs: 5.1 10*3/uL (ref 1.7–7.7)
Neutrophils Relative %: 61 %
Platelets: 303 10*3/uL (ref 150–400)
RBC: 3.86 MIL/uL — ABNORMAL LOW (ref 3.87–5.11)
RDW: 14.6 % (ref 11.5–15.5)
WBC: 8.4 10*3/uL (ref 4.0–10.5)
nRBC: 0 % (ref 0.0–0.2)

## 2019-05-19 LAB — LIPASE, BLOOD: Lipase: 29 U/L (ref 11–51)

## 2019-05-19 LAB — PREGNANCY, URINE: Preg Test, Ur: NEGATIVE

## 2019-05-19 MED ORDER — ONDANSETRON 4 MG PO TBDP
4.0000 mg | ORAL_TABLET | Freq: Once | ORAL | Status: AC
  Administered 2019-05-19: 4 mg via ORAL
  Filled 2019-05-19: qty 1

## 2019-05-19 MED ORDER — ONDANSETRON HCL 4 MG PO TABS: 4.0000 mg | ORAL_TABLET | Freq: Three times a day (TID) | ORAL | 0 refills | Status: DC | PRN

## 2019-05-19 MED ORDER — ONDANSETRON HCL 4 MG/2ML IJ SOLN
4.0000 mg | Freq: Once | INTRAMUSCULAR | Status: DC
  Filled 2019-05-19 (×2): qty 2

## 2019-05-19 MED ORDER — SODIUM CHLORIDE 0.9 % IV BOLUS
1000.0000 mL | Freq: Once | INTRAVENOUS | Status: AC
  Administered 2019-05-19: 18:00:00 1000 mL via INTRAVENOUS

## 2019-05-19 NOTE — ED Notes (Signed)
Pt provided water and requested urine sample.

## 2019-05-19 NOTE — ED Notes (Signed)
Pt iv infilitrated upon saline infusion. IV removed. Pt requesting to hold off on IV and saline unless necessary. Blood was obtained and sent to lab. EDP informed. IV zofran changed to oral.

## 2019-05-19 NOTE — ED Notes (Signed)
Patient transported to X-ray 

## 2019-05-19 NOTE — ED Notes (Signed)
Pt on monitor 

## 2019-05-19 NOTE — ED Notes (Signed)
Heat packs applied to IV site of infiltration

## 2019-05-19 NOTE — ED Provider Notes (Signed)
[facility] EMERGENCY DEPARTMENT Provider Note   CSN: [id number] Arrival date & time: 05/19/19  1653    History   Chief Complaint Chief Complaint  Patient presents with   Nausea    HPI Katie Pearson is a 38 y.o. female.     The history is provided by the patient.  Weakness Severity:  Moderate Onset quality:  Gradual Timing:  Constant Progression:  Improving Chronicity:  New Context: not alcohol use, not dehydration and not drug use   Relieved by:  Rest Worsened by:  Nothing Associated symptoms: abdominal pain (suprapubic), lethargy and nausea   Associated symptoms: no arthralgias, no chest pain, no cough, no difficulty walking, no dysuria, no numbness in extremities, no fever, no foul-smelling urine, no headaches, no seizures, no shortness of breath and no vomiting   Risk factors: coronary artery disease     Past Medical History:  Diagnosis Date   Back pain    Hypertension    MI (myocardial infarction) ([place])    Obesity    Pulmonary emboli (HCC)     There are no active problems to display for this patient.   Past Surgical History:  Procedure Laterality Date   CESAREAN SECTION     CORONARY ANGIOPLASTY WITH STENT PLACEMENT     LAMINECTOMY     TUBAL LIGATION       OB History   No obstetric history on file.      Home Medications    Prior to Admission medications   Medication Sig Start Date End Date Taking? Authorizing Provider  losartan (COZAAR) 100 MG tablet Take by mouth. 04/13/19 07/12/19 Yes [provider]  metoprolol tartrate (LOPRESSOR) 25 MG tablet Take by mouth. 04/13/19 07/12/19 Yes [provider]  ticagrelor (BRILINTA) 90 MG TABS tablet Take 90 mg by mouth 2 (two) times daily.   Yes [provider]  amLODipine (NORVASC) 10 MG tablet Take 1 tablet (10 mg total) by mouth daily. 09/11/18 10/11/18  Molpus, John, MD  aspirin EC 81 MG tablet Take by mouth.    [provider]  atorvastatin  (LIPITOR) 10 MG tablet Take 1 tablet (10 mg total) by mouth daily. 09/11/18 10/11/18  Molpus, Jenny Reichmann, MD  ondansetron (ZOFRAN) 4 MG tablet Take 1 tablet (4 mg total) by mouth every 8 (eight) hours as needed for up to 12 doses for nausea or vomiting. 05/19/19   Lennice Sites, DO    Family History No family history on file.  Social History Social History   Tobacco Use   Smoking status: Former Smoker    Packs/day: 0.25    Quit date: 10/30/2015    Years since quitting: 3.5   Smokeless tobacco: Never Used  Substance Use Topics   Alcohol use: Not Currently    Comment: occ   Drug use: No     Allergies   Ace inhibitors, Codeine, Latex, and Tape   Review of Systems Review of Systems  Constitutional: Negative for chills and fever.  HENT: Negative for ear pain and sore throat.   Eyes: Negative for pain and visual disturbance.  Respiratory: Negative for cough and shortness of breath.   Cardiovascular: Negative for chest pain and palpitations.  Gastrointestinal: Positive for abdominal pain (suprapubic) and nausea. Negative for vomiting.  Genitourinary: Negative for dysuria and hematuria.  Musculoskeletal: Negative for arthralgias and back pain.  Skin: Negative for color change and rash.  Neurological: Positive for weakness. Negative for seizures, syncope and headaches.  All other systems reviewed and are  negative.    Physical Exam Updated Vital Signs  ED Triage Vitals  Enc Vitals Group     BP 05/19/19 1702 131/78     Pulse Rate 05/19/19 1702 66     Resp 05/19/19 1702 16     Temp 05/19/19 1702 98.6 F (37 C)     Temp Source 05/19/19 1702 Oral     SpO2 05/19/19 1702 100 %     Weight 05/19/19 1703 260 lb (117.9 kg)     Height 05/19/19 1703 5' (1.524 m)     Head Circumference --      Peak Flow --      Pain Score 05/19/19 1703 7     Pain Loc --      Pain Edu? --      Excl. in [place]? --     Physical Exam Vitals signs and nursing note reviewed.  Constitutional:       General: She is not in acute distress.    Appearance: Normal appearance. She is well-developed. She is not ill-appearing.  HENT:     Head: Normocephalic and atraumatic.     Nose: Nose normal.     Mouth/Throat:     Mouth: Mucous membranes are moist.  Eyes:     Extraocular Movements: Extraocular movements intact.     Conjunctiva/sclera: Conjunctivae normal.     Pupils: Pupils are equal, round, and reactive to light.  Neck:     Musculoskeletal: Normal range of motion and neck supple.  Cardiovascular:     Rate and Rhythm: Normal rate and regular rhythm.     Pulses: Normal pulses.     Heart sounds: Normal heart sounds. No murmur.  Pulmonary:     Effort: Pulmonary effort is normal. No respiratory distress.     Breath sounds: Normal breath sounds.  Abdominal:     General: Abdomen is flat. There is no distension.     Palpations: Abdomen is soft. There is no mass.     Tenderness: There is abdominal tenderness (suprapubic, mild). There is no guarding or rebound.     Hernia: No hernia is present.  Musculoskeletal: Normal range of motion.  Skin:    General: Skin is warm and dry.  Neurological:     General: No focal deficit present.     Mental Status: She is alert and oriented to person, place, and time.     Cranial Nerves: No cranial nerve deficit.     Sensory: No sensory deficit.     Motor: No weakness.     Coordination: Coordination normal.     Gait: Gait normal.     Comments: 5+ out of 5 strength throughout, normal sensation, no drift, normal finger-to-nose finger  Psychiatric:        Mood and Affect: Mood normal.      ED Treatments / Results  Labs (all labs ordered are listed, but only abnormal results are displayed) Labs Reviewed  CBC WITH DIFFERENTIAL/PLATELET - Abnormal; Notable for the following components:      Result Value   RBC 3.86 (*)    Hemoglobin 11.6 (*)    All other components within normal limits  COMPREHENSIVE METABOLIC PANEL - Abnormal; Notable for the  following components:   Potassium 3.4 (*)    Glucose, Bld 128 (*)    All other components within normal limits  URINALYSIS, ROUTINE W REFLEX MICROSCOPIC - Abnormal; Notable for the following components:   Color, Urine RED (*)    APPearance CLOUDY (*)  Specific Gravity, Urine <1.005 (*)    Hgb urine dipstick LARGE (*)    Protein, ur 100 (*)    Leukocytes,Ua TRACE (*)    All other components within normal limits  URINALYSIS, MICROSCOPIC (REFLEX) - Abnormal; Notable for the following components:   Bacteria, UA MANY (*)    All other components within normal limits  LIPASE, BLOOD  PREGNANCY, URINE  TROPONIN I (HIGH SENSITIVITY)    EKG EKG Interpretation  Date/Time:  Wednesday May 19 2019 17:20:54 EDT Ventricular Rate:  71 PR Interval:    QRS Duration: 98 QT Interval:  437 QTC Calculation: 475 R Axis:   28 Text Interpretation:  Sinus rhythm Confirmed by Lennice Sites [phone]) on 05/19/2019 5:24:58 PM   Radiology Dg Chest 2 View  Result Date: 05/19/2019 CLINICAL DATA:  Fatigue EXAM: CHEST - 2 VIEW COMPARISON:  08/03/2017 FINDINGS: The heart size and mediastinal contours are within normal limits. Both lungs are clear. The visualized skeletal structures are unremarkable. IMPRESSION: No active cardiopulmonary disease. Electronically Signed   By: Constance Holster M.D.   On: 05/19/2019 18:55    Procedures Procedures (including critical care time)  Medications Ordered in ED Medications  ondansetron (ZOFRAN) injection 4 mg (0 mg Intravenous Hold 05/19/19 1733)  sodium chloride 0.9 % bolus 1,000 mL (0 mLs Intravenous Stopped 05/19/19 1737)  ondansetron (ZOFRAN-ODT) disintegrating tablet 4 mg (4 mg Oral Given 05/19/19 1744)     Initial Impression / Assessment and Plan / ED Course  I have reviewed the triage vital signs and the nursing notes.  Pertinent labs & imaging results that were available during my care of the patient were reviewed by me and considered in my medical  decision making (see chart for details).     Katie Pearson is a 38 year old female history of CAD status post stents who presents to the ED with nausea, fatigue.  Patient with normal vitals.  No fever.  Patient states that last night she had an episode of generalized weakness, nausea, lower abdominal pain.  Went to sleep and woke up still feeling mildly lethargic.  Denies any chest pain, shortness of breath.  No headaches.  Maybe has some mild lower abdominal pain.  Denies any vaginal discharge.  Denies any urinary symptoms.  Patient feels a little dehydrated.  Neurologic exam is normal.  No concern for stroke.  Patient does have significant cardiac history.  She states that with prior heart issues she did have chest pain which she has not had.  EKG shows sinus rhythm.  Will evaluate with labs, urinalysis, troponin.  Patient not hypoxic, not tachycardic, no shortness of breath and doubt PE.  Patient with normal troponin.  EKG shows sinus rhythm.  No ischemic changes.  Patient with no chest pain.  Doubt cardiac process.  Patient with no significant findings on chest x-ray.  No significant anemia, electrolyte abnormality, kidney injury.  Urinalysis unremarkable.  Patient felt better after Zofran.  Overall possibly some mild dehydration.  Will give prescription for Zofran.  Discharged from the ED in good condition.  Recommend follow-up with primary care doctor.  Told to return to the ED symptoms worsen.  This chart was dictated using voice recognition software.  Despite best efforts to proofread,  errors can occur which can change the documentation meaning.    Final Clinical Impressions(s) / ED Diagnoses   Final diagnoses:  Nausea    ED Discharge Orders         Ordered    ondansetron (ZOFRAN) 4  MG tablet  Every 8 hours PRN     05/19/19 2003           Lennice Sites, DO 05/19/19 2004

## 2019-05-19 NOTE — Discharge Instructions (Signed)
No signs of urinary tract infection.  Lab work unremarkable.  Take Zofran for nausea.  Stay hydrated.

## 2019-05-19 NOTE — ED Notes (Signed)
ED Provider at bedside. 

## 2019-05-19 NOTE — ED Triage Notes (Signed)
Pt had episode at 1130 pm last night of diaphoresis and nausea.  Sts she laid on the floor for about an hour until it passed. Today she is tired and fatigued and remains nauseated.

## 2019-07-09 ENCOUNTER — Other Ambulatory Visit: Payer: Self-pay

## 2019-07-09 ENCOUNTER — Encounter (HOSPITAL_BASED_OUTPATIENT_CLINIC_OR_DEPARTMENT_OTHER): Payer: Self-pay

## 2019-07-09 ENCOUNTER — Emergency Department (HOSPITAL_BASED_OUTPATIENT_CLINIC_OR_DEPARTMENT_OTHER)
Admission: EM | Admit: 2019-07-09 | Discharge: 2019-07-09 | Disposition: A | Discharge status: Home / Self Care | Payer: Medicaid Other | Attending: Emergency Medicine | Admitting: Emergency Medicine

## 2019-07-09 DIAGNOSIS — Z5321 Procedure and treatment not carried out due to patient leaving prior to being seen by health care provider: Secondary | ICD-10-CM | POA: Insufficient documentation

## 2019-07-09 DIAGNOSIS — R21 Rash and other nonspecific skin eruption: Secondary | ICD-10-CM | POA: Insufficient documentation

## 2019-07-09 NOTE — ED Triage Notes (Signed)
Pt c/o rash to chest x 3 days-NAD-steady gait

## 2019-10-05 ENCOUNTER — Other Ambulatory Visit: Payer: Self-pay

## 2019-10-05 ENCOUNTER — Encounter (HOSPITAL_BASED_OUTPATIENT_CLINIC_OR_DEPARTMENT_OTHER): Payer: Self-pay

## 2019-10-05 ENCOUNTER — Emergency Department (HOSPITAL_BASED_OUTPATIENT_CLINIC_OR_DEPARTMENT_OTHER)
Admission: EM | Admit: 2019-10-05 | Discharge: 2019-10-06 | Disposition: A | Discharge status: Home / Self Care | Payer: Medicaid Other | Attending: Emergency Medicine | Admitting: Emergency Medicine

## 2019-10-05 DIAGNOSIS — T887XXA Unspecified adverse effect of drug or medicament, initial encounter: Secondary | ICD-10-CM | POA: Diagnosis not present

## 2019-10-05 DIAGNOSIS — I252 Old myocardial infarction: Secondary | ICD-10-CM | POA: Insufficient documentation

## 2019-10-05 DIAGNOSIS — Z91040 Latex allergy status: Secondary | ICD-10-CM | POA: Diagnosis not present

## 2019-10-05 DIAGNOSIS — B9689 Other specified bacterial agents as the cause of diseases classified elsewhere: Secondary | ICD-10-CM | POA: Diagnosis not present

## 2019-10-05 DIAGNOSIS — T50905A Adverse effect of unspecified drugs, medicaments and biological substances, initial encounter: Secondary | ICD-10-CM | POA: Diagnosis not present

## 2019-10-05 DIAGNOSIS — N760 Acute vaginitis: Secondary | ICD-10-CM | POA: Insufficient documentation

## 2019-10-05 DIAGNOSIS — Z79899 Other long term (current) drug therapy: Secondary | ICD-10-CM | POA: Insufficient documentation

## 2019-10-05 DIAGNOSIS — Z87891 Personal history of nicotine dependence: Secondary | ICD-10-CM | POA: Insufficient documentation

## 2019-10-05 DIAGNOSIS — Y69 Unspecified misadventure during surgical and medical care: Secondary | ICD-10-CM | POA: Diagnosis not present

## 2019-10-05 DIAGNOSIS — I10 Essential (primary) hypertension: Secondary | ICD-10-CM | POA: Diagnosis not present

## 2019-10-05 DIAGNOSIS — Z7982 Long term (current) use of aspirin: Secondary | ICD-10-CM | POA: Diagnosis not present

## 2019-10-05 DIAGNOSIS — R102 Pelvic and perineal pain: Secondary | ICD-10-CM | POA: Diagnosis present

## 2019-10-05 LAB — URINALYSIS, ROUTINE W REFLEX MICROSCOPIC
Glucose, UA: NEGATIVE mg/dL
Hgb urine dipstick: NEGATIVE
Ketones, ur: 15 mg/dL — AB
Nitrite: NEGATIVE
Protein, ur: NEGATIVE mg/dL
Specific Gravity, Urine: 1.025 (ref 1.005–1.030)
pH: 6 (ref 5.0–8.0)

## 2019-10-05 LAB — URINALYSIS, MICROSCOPIC (REFLEX)

## 2019-10-05 LAB — PREGNANCY, URINE: Preg Test, Ur: NEGATIVE

## 2019-10-05 LAB — WET PREP, GENITAL
Sperm: NONE SEEN
Trich, Wet Prep: NONE SEEN
Yeast Wet Prep HPF POC: NONE SEEN

## 2019-10-05 MED ORDER — METRONIDAZOLE 500 MG PO TABS: 500.0000 mg | ORAL_TABLET | Freq: Two times a day (BID) | ORAL | 0 refills | Status: DC

## 2019-10-05 MED ORDER — LIDOCAINE (ANORECTAL) 5 % EX CREA: TOPICAL_CREAM | CUTANEOUS | 0 refills | Status: DC

## 2019-10-05 NOTE — ED Triage Notes (Addendum)
Pt c/o "burning" pain to external vaginal area x 3 days after using a cream to the area-also c/o fever x 3 days-NAD-steady gait

## 2019-10-05 NOTE — ED Provider Notes (Signed)
[facility] Provider Note: Georgena Spurling, MD, FACEP  CSN: [id number] MRN: [id number] ARRIVAL: 10/05/19 at 2051 ROOM: [facility]  Vaginal Pain   HISTORY OF PRESENT ILLNESS  10/05/19 11:09 PM Katie Pearson is a 38 y.o. female who used an over-the-counter "pleasure balm" 4 days ago:      She subsequently developed irritation and pain to the vulvovaginal area.  She rates the pain as an 8 out of 10.  She notes particularly a painful ulceration above the hood of the clitoris.  She has not had vaginal bleeding.  She has had white vaginal discharge.  She is also had a subjective fever for 3 days.  She has not used any other topical medications to treat her symptoms.     Past Medical History:  Diagnosis Date  . Back pain   . Hypertension   . MI (myocardial infarction) ([place])   . Obesity   . Pulmonary emboli [facility])     Past Surgical History:  Procedure Laterality Date  . CESAREAN SECTION    . CORONARY ANGIOPLASTY WITH STENT PLACEMENT    . LAMINECTOMY    . TUBAL LIGATION      No family history on file.  Social History   Tobacco Use  . Smoking status: Former Smoker    Packs/day: 0.25    Quit date: 10/30/2015    Years since quitting: 3.9  . Smokeless tobacco: Never Used  Substance Use Topics  . Alcohol use: Yes    Comment: occ  . Drug use: No    Prior to Admission medications   Medication Sig Start Date End Date Taking? Authorizing Provider  amLODipine (NORVASC) 10 MG tablet Take 1 tablet (10 mg total) by mouth daily. 09/11/18 10/11/18  Annaleese Guier, MD  aspirin EC 81 MG tablet Take by mouth.    [provider]  atorvastatin (LIPITOR) 10 MG tablet Take 1 tablet (10 mg total) by mouth daily. 09/11/18 10/11/18  Dezmin Kittelson, MD  Lidocaine, Anorectal, 5 % CREA Apply to painful lesions as needed. 10/05/19   Shakai Dolley, MD  losartan (COZAAR) 100 MG tablet Take by mouth. 04/13/19 07/12/19  [provider]  metoprolol tartrate  (LOPRESSOR) 25 MG tablet Take by mouth. 04/13/19 07/12/19  [provider]  metroNIDAZOLE (FLAGYL) 500 MG tablet Take 1 tablet (500 mg total) by mouth 2 (two) times daily. One po bid x 7 days 10/05/19   Trystian Crisanto, MD  ondansetron (ZOFRAN) 4 MG tablet Take 1 tablet (4 mg total) by mouth every 8 (eight) hours as needed for up to 12 doses for nausea or vomiting. 05/19/19   Lennice Sites, DO  ticagrelor (BRILINTA) 90 MG TABS tablet Take 90 mg by mouth 2 (two) times daily.    [provider]    Allergies Ace inhibitors, Codeine, Latex, and Tape   REVIEW OF SYSTEMS  Negative except as noted here or in the History of Present Illness.   PHYSICAL EXAMINATION  Initial Vital Signs Blood pressure (!) 142/85, pulse 79, temperature 99.2 F (37.3 C), temperature source Oral, resp. rate 18, height 5\' 5"  (1.651 m), weight 117.9 kg, last menstrual period 09/14/2019, SpO2 98 %.  Examination General: Well-developed, well-nourished female in no acute distress; appearance consistent with age of record HENT: normocephalic; atraumatic Eyes: pupils equal, round and reactive to light; extraocular muscles intact Neck: supple Heart: regular rate and rhythm Lungs: clear to auscultation bilaterally Abdomen: soft; nondistended; mild suprapubic tenderness; bowel sounds present GU:  Tender ulceration superior to the clitoris; no vulvovaginal inflammation seen; white vaginal discharge; no vaginal bleeding; no cervical erythema; mild cervical motion tenderness Extremities: No deformity; full range of motion; pulses normal Neurologic: Awake, alert and oriented; motor function intact in all extremities and symmetric; no facial droop Skin: Warm and dry Psychiatric: Normal mood and affect   RESULTS  Summary of this visit's results, reviewed and interpreted by myself:   EKG Interpretation  Date/Time:    Ventricular Rate:    PR Interval:    QRS Duration:   QT Interval:    QTC Calculation:   R  Axis:     Text Interpretation:        Laboratory Studies: Results for orders placed or performed during the hospital encounter of 10/05/19 (from the past 24 hour(s))  Urinalysis, Routine w reflex microscopic     Status: Abnormal   Collection Time: 10/05/19  9:02 PM  Result Value Ref Range   Color, Urine YELLOW YELLOW   APPearance CLOUDY (A) CLEAR   Specific Gravity, Urine 1.025 1.005 - 1.030   pH 6.0 5.0 - 8.0   Glucose, UA NEGATIVE NEGATIVE mg/dL   Hgb urine dipstick NEGATIVE NEGATIVE   Bilirubin Urine SMALL (A) NEGATIVE   Ketones, ur 15 (A) NEGATIVE mg/dL   Protein, ur NEGATIVE NEGATIVE mg/dL   Nitrite NEGATIVE NEGATIVE   Leukocytes,Ua TRACE (A) NEGATIVE  Pregnancy, urine     Status: None   Collection Time: 10/05/19  9:02 PM  Result Value Ref Range   Preg Test, Ur NEGATIVE NEGATIVE  Urinalysis, Microscopic (reflex)     Status: Abnormal   Collection Time: 10/05/19  9:02 PM  Result Value Ref Range   RBC / HPF 6-10 0 - 5 RBC/hpf   WBC, UA 6-10 0 - 5 WBC/hpf   Bacteria, UA RARE (A) NONE SEEN   Squamous Epithelial / LPF 6-10 0 - 5  Wet prep, genital     Status: Abnormal   Collection Time: 10/05/19 11:21 PM  Result Value Ref Range   Yeast Wet Prep HPF POC NONE SEEN NONE SEEN   Trich, Wet Prep NONE SEEN NONE SEEN   Clue Cells Wet Prep HPF POC PRESENT (A) NONE SEEN   WBC, Wet Prep HPF POC FEW (A) NONE SEEN   Sperm NONE SEEN    Imaging Studies: No results found.  ED COURSE and MDM  Nursing notes, initial and subsequent vitals signs, including pulse oximetry, reviewed and interpreted by myself.  Vitals:   10/05/19 2100 10/05/19 2101  BP: (!) 142/85   Pulse: 79   Resp: 18   Temp: 99.2 F (37.3 C)   TempSrc: Oral   SpO2: 98%   Weight:  117.9 kg  Height:  5\' 5"  (1.651 m)   Medications - No data to display  We will treat the patient for bacterial vaginosis given presence of clue cells and vaginal discharge on exam.  The external lesion superior to the hood of the  clitoris could represent herpes but there is no vesicle present and testing would be low yield at this point.  We will treat with topical lidocaine for comfort.  She was advised to avoid over-the-counter topical compounds in the future.  GC and Chlamydia are pending.  PROCEDURES  Procedures   ED DIAGNOSES     ICD-10-CM   1. Adverse reaction to over-the-counter medication, initial encounter  T50.905A   2. BV (bacterial vaginosis)  N76.0    B96.89  Shanon Rosser, MD 10/05/19 2352

## 2019-10-07 LAB — GC/CHLAMYDIA PROBE AMP (~~LOC~~) NOT AT ARMC
Chlamydia: NEGATIVE
Neisseria Gonorrhea: NEGATIVE

## 2020-03-27 ENCOUNTER — Encounter (HOSPITAL_BASED_OUTPATIENT_CLINIC_OR_DEPARTMENT_OTHER): Payer: Self-pay | Admitting: *Deleted

## 2020-03-27 ENCOUNTER — Other Ambulatory Visit: Payer: Self-pay

## 2020-03-27 ENCOUNTER — Emergency Department (HOSPITAL_BASED_OUTPATIENT_CLINIC_OR_DEPARTMENT_OTHER)
Admission: EM | Admit: 2020-03-27 | Discharge: 2020-03-27 | Disposition: A | Discharge status: Home / Self Care | Payer: Medicaid Other | Attending: Emergency Medicine | Admitting: Emergency Medicine

## 2020-03-27 DIAGNOSIS — Z885 Allergy status to narcotic agent status: Secondary | ICD-10-CM | POA: Insufficient documentation

## 2020-03-27 DIAGNOSIS — Z79899 Other long term (current) drug therapy: Secondary | ICD-10-CM | POA: Diagnosis not present

## 2020-03-27 DIAGNOSIS — Z91040 Latex allergy status: Secondary | ICD-10-CM | POA: Diagnosis not present

## 2020-03-27 DIAGNOSIS — I10 Essential (primary) hypertension: Secondary | ICD-10-CM | POA: Diagnosis not present

## 2020-03-27 DIAGNOSIS — I252 Old myocardial infarction: Secondary | ICD-10-CM | POA: Insufficient documentation

## 2020-03-27 DIAGNOSIS — Z7982 Long term (current) use of aspirin: Secondary | ICD-10-CM | POA: Insufficient documentation

## 2020-03-27 DIAGNOSIS — Z87891 Personal history of nicotine dependence: Secondary | ICD-10-CM | POA: Diagnosis not present

## 2020-03-27 DIAGNOSIS — M5442 Lumbago with sciatica, left side: Secondary | ICD-10-CM | POA: Diagnosis not present

## 2020-03-27 DIAGNOSIS — M545 Low back pain: Secondary | ICD-10-CM | POA: Diagnosis present

## 2020-03-27 DIAGNOSIS — Z888 Allergy status to other drugs, medicaments and biological substances status: Secondary | ICD-10-CM | POA: Insufficient documentation

## 2020-03-27 MED ORDER — LIDOCAINE 5 % EX PTCH: 1.0000 | MEDICATED_PATCH | CUTANEOUS | 0 refills | Status: DC

## 2020-03-27 MED ORDER — METHYLPREDNISOLONE 4 MG PO TBPK: ORAL_TABLET | ORAL | 0 refills | Status: DC

## 2020-03-27 MED ORDER — KETOROLAC TROMETHAMINE 60 MG/2ML IM SOLN
60.0000 mg | Freq: Once | INTRAMUSCULAR | Status: AC
  Administered 2020-03-27: 21:00:00 60 mg via INTRAMUSCULAR
  Filled 2020-03-27: qty 2

## 2020-03-27 NOTE — Discharge Instructions (Signed)
Please pick up medication and take as prescribed Follow up with your PCP regarding your ED visit today  Return to the ED IMMEDIATELY for any worsening signs including worsening pain, numbness to your groin, inability to control your bladder or bowels, holding onto urine, fevers > 100.4, weakness or numbness in your legs
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
## 2020-12-17 ENCOUNTER — Encounter (HOSPITAL_COMMUNITY)
Admission: EM | Disposition: A | Discharge status: Home / Self Care | Payer: Self-pay | Source: Home / Self Care | Attending: Internal Medicine

## 2020-12-17 ENCOUNTER — Inpatient Hospital Stay (HOSPITAL_COMMUNITY)
Admission: EM | Admit: 2020-12-17 | Discharge: 2020-12-19 | DRG: 246 | Disposition: A | Discharge status: Home / Self Care | Payer: Medicaid Other | Attending: Internal Medicine | Admitting: Internal Medicine

## 2020-12-17 ENCOUNTER — Emergency Department (HOSPITAL_COMMUNITY): Payer: Medicaid Other

## 2020-12-17 DIAGNOSIS — E119 Type 2 diabetes mellitus without complications: Secondary | ICD-10-CM | POA: Diagnosis not present

## 2020-12-17 DIAGNOSIS — Z91048 Other nonmedicinal substance allergy status: Secondary | ICD-10-CM

## 2020-12-17 DIAGNOSIS — Z9114 Patient's other noncompliance with medication regimen: Secondary | ICD-10-CM

## 2020-12-17 DIAGNOSIS — I10 Essential (primary) hypertension: Secondary | ICD-10-CM | POA: Diagnosis present

## 2020-12-17 DIAGNOSIS — I2111 ST elevation (STEMI) myocardial infarction involving right coronary artery: Secondary | ICD-10-CM | POA: Diagnosis not present

## 2020-12-17 DIAGNOSIS — U071 COVID-19: Secondary | ICD-10-CM | POA: Diagnosis present

## 2020-12-17 DIAGNOSIS — E1169 Type 2 diabetes mellitus with other specified complication: Secondary | ICD-10-CM | POA: Diagnosis present

## 2020-12-17 DIAGNOSIS — Z885 Allergy status to narcotic agent status: Secondary | ICD-10-CM

## 2020-12-17 DIAGNOSIS — Z86718 Personal history of other venous thrombosis and embolism: Secondary | ICD-10-CM | POA: Diagnosis not present

## 2020-12-17 DIAGNOSIS — Z91040 Latex allergy status: Secondary | ICD-10-CM

## 2020-12-17 DIAGNOSIS — Z6841 Body Mass Index (BMI) 40.0 and over, adult: Secondary | ICD-10-CM

## 2020-12-17 DIAGNOSIS — Z79899 Other long term (current) drug therapy: Secondary | ICD-10-CM

## 2020-12-17 DIAGNOSIS — E785 Hyperlipidemia, unspecified: Secondary | ICD-10-CM | POA: Diagnosis present

## 2020-12-17 DIAGNOSIS — Z7982 Long term (current) use of aspirin: Secondary | ICD-10-CM

## 2020-12-17 DIAGNOSIS — Z7902 Long term (current) use of antithrombotics/antiplatelets: Secondary | ICD-10-CM | POA: Diagnosis not present

## 2020-12-17 DIAGNOSIS — I213 ST elevation (STEMI) myocardial infarction of unspecified site: Secondary | ICD-10-CM | POA: Diagnosis present

## 2020-12-17 DIAGNOSIS — I252 Old myocardial infarction: Secondary | ICD-10-CM

## 2020-12-17 DIAGNOSIS — I228 Subsequent ST elevation (STEMI) myocardial infarction of other sites: Secondary | ICD-10-CM | POA: Diagnosis present

## 2020-12-17 DIAGNOSIS — I351 Nonrheumatic aortic (valve) insufficiency: Secondary | ICD-10-CM | POA: Diagnosis present

## 2020-12-17 DIAGNOSIS — E118 Type 2 diabetes mellitus with unspecified complications: Secondary | ICD-10-CM

## 2020-12-17 DIAGNOSIS — Y831 Surgical operation with implant of artificial internal device as the cause of abnormal reaction of the patient, or of later complication, without mention of misadventure at the time of the procedure: Secondary | ICD-10-CM | POA: Diagnosis present

## 2020-12-17 DIAGNOSIS — I214 Non-ST elevation (NSTEMI) myocardial infarction: Secondary | ICD-10-CM | POA: Diagnosis present

## 2020-12-17 DIAGNOSIS — T82867A Thrombosis of cardiac prosthetic devices, implants and grafts, initial encounter: Principal | ICD-10-CM | POA: Diagnosis present

## 2020-12-17 DIAGNOSIS — I2510 Atherosclerotic heart disease of native coronary artery without angina pectoris: Secondary | ICD-10-CM | POA: Diagnosis present

## 2020-12-17 DIAGNOSIS — Z87891 Personal history of nicotine dependence: Secondary | ICD-10-CM | POA: Diagnosis not present

## 2020-12-17 DIAGNOSIS — E876 Hypokalemia: Secondary | ICD-10-CM | POA: Diagnosis present

## 2020-12-17 DIAGNOSIS — Z955 Presence of coronary angioplasty implant and graft: Secondary | ICD-10-CM

## 2020-12-17 DIAGNOSIS — Z888 Allergy status to other drugs, medicaments and biological substances status: Secondary | ICD-10-CM | POA: Diagnosis not present

## 2020-12-17 DIAGNOSIS — R079 Chest pain, unspecified: Secondary | ICD-10-CM | POA: Diagnosis present

## 2020-12-17 DIAGNOSIS — Z86711 Personal history of pulmonary embolism: Secondary | ICD-10-CM

## 2020-12-17 HISTORY — PX: LEFT HEART CATH AND CORONARY ANGIOGRAPHY: CATH118249

## 2020-12-17 HISTORY — PX: CORONARY/GRAFT ACUTE MI REVASCULARIZATION: CATH118305

## 2020-12-17 LAB — BASIC METABOLIC PANEL WITH GFR
Anion gap: 15 (ref 5–15)
BUN: 13 mg/dL (ref 6–20)
CO2: 17 mmol/L — ABNORMAL LOW (ref 22–32)
Calcium: 8.8 mg/dL — ABNORMAL LOW (ref 8.9–10.3)
Chloride: 105 mmol/L (ref 98–111)
Creatinine, Ser: 0.75 mg/dL (ref 0.44–1.00)
GFR, Estimated: >60 mL/min
Glucose, Bld: 190 mg/dL — ABNORMAL HIGH (ref 70–99)
Potassium: 4.4 mmol/L (ref 3.5–5.1)
Sodium: 137 mmol/L (ref 135–145)

## 2020-12-17 LAB — SARS CORONAVIRUS 2 BY RT PCR (HOSPITAL ORDER, PERFORMED IN ~~LOC~~ HOSPITAL LAB): SARS Coronavirus 2: POSITIVE — AB

## 2020-12-17 LAB — TROPONIN I (HIGH SENSITIVITY)
Troponin I (High Sensitivity): 99 ng/L — ABNORMAL HIGH (ref <18)
Troponin I (High Sensitivity): 259 ng/L (ref <18)
Troponin I (High Sensitivity): >27000 ng/L (ref <18)

## 2020-12-17 LAB — CBC
HCT: 33.7 % — ABNORMAL LOW (ref 36.0–46.0)
Hemoglobin: 11.5 g/dL — ABNORMAL LOW (ref 12.0–15.0)
MCH: 31.1 pg (ref 26.0–34.0)
MCHC: 34.1 g/dL (ref 30.0–36.0)
MCV: 91.1 fL (ref 80.0–100.0)
Platelets: 183 10*3/uL (ref 150–400)
RBC: 3.7 MIL/uL — ABNORMAL LOW (ref 3.87–5.11)
RDW: 15.4 % (ref 11.5–15.5)
WBC: 9.6 10*3/uL (ref 4.0–10.5)
nRBC: 0 % (ref 0.0–0.2)

## 2020-12-17 LAB — HCG, QUANTITATIVE, PREGNANCY: hCG, Beta Chain, Quant, S: <1 m[IU]/mL (ref <5)

## 2020-12-17 LAB — MRSA PCR SCREENING: MRSA by PCR: NEGATIVE

## 2020-12-17 SURGERY — CORONARY/GRAFT ACUTE MI REVASCULARIZATION
Anesthesia: LOCAL

## 2020-12-17 MED ORDER — ONDANSETRON HCL 4 MG/2ML IJ SOLN
4.0000 mg | Freq: Once | INTRAMUSCULAR | Status: AC
  Administered 2020-12-17: 4 mg via INTRAVENOUS
  Filled 2020-12-17: qty 2

## 2020-12-17 MED ORDER — IOHEXOL 350 MG/ML SOLN
INTRAVENOUS | Status: DC | PRN
  Administered 2020-12-17: 130 mL via INTRA_ARTERIAL

## 2020-12-17 MED ORDER — TIROFIBAN HCL IN NACL 5-0.9 MG/100ML-% IV SOLN
INTRAVENOUS | Status: AC | PRN
  Administered 2020-12-17 (×2): 0.15 ug/kg/min via INTRAVENOUS

## 2020-12-17 MED ORDER — HEPARIN SODIUM (PORCINE) 5000 UNIT/ML IJ SOLN
4000.0000 [IU] | Freq: Once | INTRAMUSCULAR | Status: AC
  Administered 2020-12-17: 4000 [IU] via INTRAVENOUS

## 2020-12-17 MED ORDER — NITROGLYCERIN 1 MG/10 ML FOR IR/CATH LAB
INTRA_ARTERIAL | Status: AC
  Filled 2020-12-17: qty 10

## 2020-12-17 MED ORDER — ASPIRIN 81 MG PO CHEW: 324.0000 mg | CHEWABLE_TABLET | Freq: Once | ORAL | Status: DC

## 2020-12-17 MED ORDER — LABETALOL HCL 5 MG/ML IV SOLN: 10.0000 mg | INTRAVENOUS | Status: AC | PRN

## 2020-12-17 MED ORDER — ASPIRIN EC 81 MG PO TBEC
81.0000 mg | DELAYED_RELEASE_TABLET | Freq: Every day | ORAL | Status: DC
  Administered 2020-12-18 – 2020-12-19 (×2): 81 mg via ORAL
  Filled 2020-12-17 (×2): qty 1

## 2020-12-17 MED ORDER — LIDOCAINE HCL (PF) 1 % IJ SOLN
INTRAMUSCULAR | Status: AC
  Filled 2020-12-17: qty 30

## 2020-12-17 MED ORDER — SODIUM CHLORIDE 0.9% FLUSH
3.0000 mL | Freq: Two times a day (BID) | INTRAVENOUS | Status: DC
  Administered 2020-12-17 – 2020-12-19 (×3): 3 mL via INTRAVENOUS

## 2020-12-17 MED ORDER — ACETAMINOPHEN 325 MG PO TABS
650.0000 mg | ORAL_TABLET | ORAL | Status: DC | PRN
  Administered 2020-12-17 – 2020-12-19 (×5): 650 mg via ORAL
  Filled 2020-12-17 (×5): qty 2

## 2020-12-17 MED ORDER — TIROFIBAN HCL IN NACL 5-0.9 MG/100ML-% IV SOLN
0.1500 ug/kg/min | INTRAVENOUS | Status: DC
  Administered 2020-12-18: 0.15 ug/kg/min via INTRAVENOUS
  Filled 2020-12-17: qty 100

## 2020-12-17 MED ORDER — HEPARIN (PORCINE) IN NACL 1000-0.9 UT/500ML-% IV SOLN
INTRAVENOUS | Status: AC
  Filled 2020-12-17: qty 1000

## 2020-12-17 MED ORDER — FENTANYL CITRATE (PF) 100 MCG/2ML IJ SOLN
INTRAMUSCULAR | Status: DC | PRN
  Administered 2020-12-17 (×2): 25 ug via INTRAVENOUS

## 2020-12-17 MED ORDER — MORPHINE SULFATE (PF) 4 MG/ML IV SOLN
4.0000 mg | Freq: Once | INTRAVENOUS | Status: AC
  Administered 2020-12-17: 4 mg via INTRAVENOUS
  Filled 2020-12-17: qty 1

## 2020-12-17 MED ORDER — ONDANSETRON HCL 4 MG/2ML IJ SOLN: 4.0000 mg | Freq: Four times a day (QID) | INTRAMUSCULAR | Status: DC | PRN

## 2020-12-17 MED ORDER — FUROSEMIDE 10 MG/ML IJ SOLN
20.0000 mg | Freq: Once | INTRAMUSCULAR | Status: AC
  Administered 2020-12-17: 20 mg via INTRAVENOUS
  Filled 2020-12-17: qty 2

## 2020-12-17 MED ORDER — ALPRAZOLAM 0.25 MG PO TABS
0.2500 mg | ORAL_TABLET | Freq: Three times a day (TID) | ORAL | Status: AC | PRN
  Administered 2020-12-17: 0.25 mg via ORAL
  Filled 2020-12-17: qty 1

## 2020-12-17 MED ORDER — SODIUM CHLORIDE 0.9% FLUSH: 3.0000 mL | INTRAVENOUS | Status: DC | PRN

## 2020-12-17 MED ORDER — TICAGRELOR 90 MG PO TABS: 90.0000 mg | ORAL_TABLET | Freq: Two times a day (BID) | ORAL | Status: DC

## 2020-12-17 MED ORDER — VERAPAMIL HCL 2.5 MG/ML IV SOLN
INTRAVENOUS | Status: DC | PRN
  Administered 2020-12-17: 10 mL via INTRA_ARTERIAL

## 2020-12-17 MED ORDER — NITROGLYCERIN 1 MG/10 ML FOR IR/CATH LAB
INTRA_ARTERIAL | Status: DC | PRN
  Administered 2020-12-17: 200 ug via INTRACORONARY

## 2020-12-17 MED ORDER — TIROFIBAN (AGGRASTAT) BOLUS VIA INFUSION
INTRAVENOUS | Status: DC | PRN
  Administered 2020-12-17: 2947.5 ug via INTRAVENOUS

## 2020-12-17 MED ORDER — TIROFIBAN HCL IN NACL 5-0.9 MG/100ML-% IV SOLN
INTRAVENOUS | Status: AC
  Filled 2020-12-17: qty 100

## 2020-12-17 MED ORDER — HYDRALAZINE HCL 20 MG/ML IJ SOLN: 10.0000 mg | INTRAMUSCULAR | Status: AC | PRN

## 2020-12-17 MED ORDER — METOPROLOL TARTRATE 25 MG PO TABS
25.0000 mg | ORAL_TABLET | Freq: Two times a day (BID) | ORAL | Status: DC
  Administered 2020-12-17 – 2020-12-19 (×4): 25 mg via ORAL
  Filled 2020-12-17 (×4): qty 1

## 2020-12-17 MED ORDER — ATORVASTATIN CALCIUM 80 MG PO TABS
80.0000 mg | ORAL_TABLET | Freq: Every day | ORAL | Status: DC
  Administered 2020-12-18 – 2020-12-19 (×2): 80 mg via ORAL
  Filled 2020-12-17 (×2): qty 1

## 2020-12-17 MED ORDER — MIDAZOLAM HCL 2 MG/2ML IJ SOLN
INTRAMUSCULAR | Status: DC | PRN
  Administered 2020-12-17 (×2): 1 mg via INTRAVENOUS

## 2020-12-17 MED ORDER — TICAGRELOR 90 MG PO TABS
90.0000 mg | ORAL_TABLET | Freq: Two times a day (BID) | ORAL | Status: DC
Start: 2020-12-18 — End: 2020-12-19
  Administered 2020-12-18 – 2020-12-19 (×3): 90 mg via ORAL
  Filled 2020-12-17 (×3): qty 1

## 2020-12-17 MED ORDER — ASPIRIN 81 MG PO CHEW
81.0000 mg | CHEWABLE_TABLET | Freq: Once | ORAL | Status: AC
  Administered 2020-12-17: 81 mg via ORAL

## 2020-12-17 MED ORDER — TICAGRELOR 90 MG PO TABS
ORAL_TABLET | ORAL | Status: DC | PRN
  Administered 2020-12-17: 180 mg via ORAL

## 2020-12-17 MED ORDER — ENOXAPARIN SODIUM 40 MG/0.4ML ~~LOC~~ SOLN
40.0000 mg | SUBCUTANEOUS | Status: DC
  Administered 2020-12-18: 40 mg via SUBCUTANEOUS
  Filled 2020-12-17 (×2): qty 0.4

## 2020-12-17 MED ORDER — VERAPAMIL HCL 2.5 MG/ML IV SOLN
INTRAVENOUS | Status: AC
  Filled 2020-12-17: qty 2

## 2020-12-17 MED ORDER — MIDAZOLAM HCL 2 MG/2ML IJ SOLN
INTRAMUSCULAR | Status: AC
  Filled 2020-12-17: qty 2

## 2020-12-17 MED ORDER — LIDOCAINE HCL (PF) 1 % IJ SOLN
INTRAMUSCULAR | Status: DC | PRN
  Administered 2020-12-17: 5 mL via SUBCUTANEOUS

## 2020-12-17 MED ORDER — HEPARIN (PORCINE) IN NACL 1000-0.9 UT/500ML-% IV SOLN
INTRAVENOUS | Status: DC | PRN
  Administered 2020-12-17 (×2): 500 mL

## 2020-12-17 MED ORDER — HEPARIN SODIUM (PORCINE) 1000 UNIT/ML IJ SOLN
INTRAMUSCULAR | Status: DC | PRN
  Administered 2020-12-17: 2000 [IU] via INTRAVENOUS
  Administered 2020-12-17: 5000 [IU] via INTRAVENOUS

## 2020-12-17 MED ORDER — FENTANYL CITRATE (PF) 100 MCG/2ML IJ SOLN
INTRAMUSCULAR | Status: AC
  Filled 2020-12-17: qty 2

## 2020-12-17 MED ORDER — SODIUM CHLORIDE 0.9 % IV SOLN: 250.0000 mL | INTRAVENOUS | Status: DC | PRN

## 2020-12-17 MED ORDER — NITROGLYCERIN IN D5W 200-5 MCG/ML-% IV SOLN
0.0000 ug/min | INTRAVENOUS | Status: DC
  Administered 2020-12-17: 5 ug/min via INTRAVENOUS
  Filled 2020-12-17: qty 250

## 2020-12-17 SURGICAL SUPPLY — 23 items
BALLN SAPPHIRE 2.0X12 (BALLOONS) ×2
BALLN SAPPHIRE 2.25X10 (BALLOONS) ×2
BALLN SAPPHIRE ~~LOC~~ 3.25X8 (BALLOONS) ×2 IMPLANT
BALLN SAPPHIRE ~~LOC~~ 3.75X18 (BALLOONS) ×2 IMPLANT
BALLOON SAPPHIRE 2.0X12 (BALLOONS) ×1 IMPLANT
BALLOON SAPPHIRE 2.25X10 (BALLOONS) ×1 IMPLANT
CATH 5FR JL3.5 JR4 ANG PIG MP (CATHETERS) ×2 IMPLANT
CATH OPTICROSS HD (CATHETERS) ×2 IMPLANT
CATH VISTA GUIDE 6FR JR4 (CATHETERS) ×2 IMPLANT
DEVICE RAD COMP TR BAND LRG (VASCULAR PRODUCTS) ×2 IMPLANT
GLIDESHEATH SLEND SS 6F .021 (SHEATH) ×2 IMPLANT
GUIDEWIRE INQWIRE 1.5J.035X260 (WIRE) ×1 IMPLANT
INQWIRE 1.5J .035X260CM (WIRE) ×2
KIT ENCORE 26 ADVANTAGE (KITS) ×2 IMPLANT
KIT HEART LEFT (KITS) ×2 IMPLANT
KIT HEMO VALVE WATCHDOG (MISCELLANEOUS) ×2 IMPLANT
PACK CARDIAC CATHETERIZATION (CUSTOM PROCEDURE TRAY) ×2 IMPLANT
SLED PULL BACK IVUS (MISCELLANEOUS) ×2 IMPLANT
STENT RESOLUTE ONYX 3.5X22 (Permanent Stent) ×2 IMPLANT
TRANSDUCER W/STOPCOCK (MISCELLANEOUS) ×2 IMPLANT
TUBING CIL FLEX 10 FLL-RA (TUBING) ×2 IMPLANT
WIRE COUGAR XT STRL 190CM (WIRE) ×2 IMPLANT
WIRE RUNTHROUGH .014X180CM (WIRE) ×2 IMPLANT

## 2020-12-17 NOTE — Progress Notes (Signed)
ANTICOAGULATION CONSULT NOTE - Initial Consult  Pharmacy Consult for Heparin  > aggrastat Indication: chest pain/ACS  Allergies  Allergen Reactions  . Ace Inhibitors Swelling    .   Marland Kitchen Codeine Nausea And Vomiting  . Latex Itching and Rash  . Tape Itching and Rash    Patient Measurements: Height: 5\' 5"  (165.1 cm) Weight: 117.9 kg (260 lb) IBW/kg (Calculated) : 57 Heparin Dosing Weight: n/a  Vital Signs: Temp: 97.9 F (36.6 C) (01/23 1538) Temp Source: Oral (01/23 1538) BP: 167/97 (01/23 1930) Pulse Rate: 91 (01/23 1930)  Labs: Recent Labs    12/17/20 1558 12/17/20 1737  HGB 11.5*  --   HCT 33.7*  --   PLT 183  --   CREATININE 0.75  --   TROPONINIHS 99* 259*    Estimated Creatinine Clearance: 122.5 mL/min (by C-G formula based on SCr of 0.75 mg/dL).   Medical History: Past Medical History:  Diagnosis Date  . Back pain   . Hypertension   . MI (myocardial infarction) ([place])   . Obesity   . Pulmonary emboli ([facility])     Medications:  Scheduled:  . [START ON 12/18/2020] enoxaparin (LOVENOX) injection  40 mg Subcutaneous Q24H  . [START ON 12/18/2020] ticagrelor  90 mg Oral Q12H    Assessment: 28 s/p STEMI.  Heparin given prior to cath lab, then not resumed after.  Asked to continue tirofiban x 6 hrs after cath lab.  Baseline CBC WNL.  Goal of Therapy:  Monitor platelets by anticoagulation protocol: Yes   Plan:  Tirofiban 0.15 mcg/kg/min x 4 more hours (total of 6 hrs), then off. Pharmacy will monitor peripherally.  [place] Crane, Roylene Reason, BCCP Clinical Pharmacist  12/17/2020 8:31 PM   [facility] pharmacy phone numbers are listed on [email or web address]

## 2020-12-17 NOTE — ED Provider Notes (Signed)
[facility] EMERGENCY DEPARTMENT Provider Note   CSN: [id number] Arrival date & time: 12/17/20  1520     History Chief Complaint  Patient presents with  . Back Pain  . Chest Pain    Katie Pearson is a 40 y.o. female.  40 year old female with prior medical history as detailed below presents for evaluation.  Patient reports onset of substernal chest pain.  She reports 10 out of 10 pain.  Pain began approximately 2 hours prior to arrival.  She also reports recent cardiac catheterization with stent placement in [place].  She reports that her RCA was stented (reported in-stent stenosis).  Additional records are available in care everywhere of recent cardiac catheterization.    Reports a total of 6 cardiac stents previously.  Patient's pain today is associated with nausea.  She vomited upon arrival to the room.   She reports full compliance with all medications.    The history is provided by the patient and medical records.  Chest Pain Pain location:  Substernal area Pain quality: aching and pressure   Pain radiates to:  Does not radiate Pain severity:  Moderate Onset quality:  Sudden Duration:  2 hours Timing:  Constant Progression:  Worsening Chronicity:  New Relieved by:  Nothing Worsened by:  Nothing      Past Medical History:  Diagnosis Date  . Back pain   . Hypertension   . MI (myocardial infarction) ([place])   . Obesity   . Pulmonary emboli ([facility])     There are no problems to display for this patient.   Past Surgical History:  Procedure Laterality Date  . CESAREAN SECTION    . CORONARY ANGIOPLASTY WITH STENT PLACEMENT    . LAMINECTOMY    . TUBAL LIGATION       OB History   No obstetric history on file.     No family history on file.  Social History   Tobacco Use  . Smoking status: Former Smoker    Packs/day: 0.25    Quit date: 10/30/2015    Years since quitting: 5.1  . Smokeless tobacco: Never Used  Vaping Use  . Vaping  Use: Never used  Substance Use Topics  . Alcohol use: Yes    Comment: occ  . Drug use: No    Home Medications Prior to Admission medications   Medication Sig Start Date End Date Taking? Authorizing Provider  amLODipine (NORVASC) 10 MG tablet Take 1 tablet (10 mg total) by mouth daily. 09/11/18 10/11/18  Molpus, John, MD  aspirin EC 81 MG tablet Take by mouth.    [provider]  atorvastatin (LIPITOR) 10 MG tablet Take 1 tablet (10 mg total) by mouth daily. 09/11/18 10/11/18  Molpus, John, MD  lidocaine (LIDODERM) 5 % Place 1 patch onto the skin daily. Remove & Discard patch within 12 hours or as directed by MD 03/27/20   Eustaquio Maize, PA-C  Lidocaine, Anorectal, 5 % CREA Apply to painful lesions as needed. 10/05/19   Molpus, John, MD  losartan (COZAAR) 100 MG tablet Take by mouth. 04/13/19 07/12/19  [provider]  methylPREDNISolone (MEDROL DOSEPAK) 4 MG TBPK tablet Follow package insert for instructions 03/27/20   Alroy Bailiff, Margaux, PA-C  metoprolol tartrate (LOPRESSOR) 25 MG tablet Take by mouth. 04/13/19 07/12/19  [provider]  metroNIDAZOLE (FLAGYL) 500 MG tablet Take 1 tablet (500 mg total) by mouth 2 (two) times daily. One po bid x 7 days 10/05/19   Molpus, Jenny Reichmann, MD  ondansetron (ZOFRAN) 4 MG tablet Take 1 tablet (4 mg total) by mouth every 8 (eight) hours as needed for up to 12 doses for nausea or vomiting. 05/19/19   Lennice Sites, DO  ticagrelor (BRILINTA) 90 MG TABS tablet Take 90 mg by mouth 2 (two) times daily.    [provider]    Allergies    Ace inhibitors, Codeine, Latex, and Tape  Review of Systems   Review of Systems  Cardiovascular: Positive for chest pain.  All other systems reviewed and are negative.   Physical Exam Updated Vital Signs BP (!) 158/66 (BP Location: Left Arm)   Pulse 85   Temp 97.9 F (36.6 C) (Oral)   Resp (!) 26   SpO2 100%   Physical Exam Vitals and nursing note reviewed.  Constitutional:       General: She is not in acute distress.    Appearance: She is well-developed and well-nourished. She is ill-appearing.  HENT:     Head: Normocephalic and atraumatic.     Mouth/Throat:     Mouth: Oropharynx is clear and moist.  Eyes:     Extraocular Movements: EOM normal.     Conjunctiva/sclera: Conjunctivae normal.     Pupils: Pupils are equal, round, and reactive to light.  Cardiovascular:     Rate and Rhythm: Normal rate and regular rhythm.     Heart sounds: Normal heart sounds.  Pulmonary:     Effort: Pulmonary effort is normal. No respiratory distress.     Breath sounds: Normal breath sounds.  Abdominal:     General: There is no distension.     Palpations: Abdomen is soft.     Tenderness: There is no abdominal tenderness.  Musculoskeletal:        General: No deformity or edema. Normal range of motion.     Cervical back: Normal range of motion and neck supple.  Skin:    General: Skin is warm and dry.  Neurological:     Mental Status: She is alert and oriented to person, place, and time.  Psychiatric:        Mood and Affect: Mood and affect normal.     ED Results / Procedures / Treatments   Labs (all labs ordered are listed, but only abnormal results are displayed) Labs Reviewed  BASIC METABOLIC PANEL - Abnormal; Notable for the following components:      Result Value   CO2 17 (*)    Glucose, Bld 190 (*)    Calcium 8.8 (*)    All other components within normal limits  CBC - Abnormal; Notable for the following components:   RBC 3.70 (*)    Hemoglobin 11.5 (*)    HCT 33.7 (*)    All other components within normal limits  TROPONIN I (HIGH SENSITIVITY) - Abnormal; Notable for the following components:   Troponin I (High Sensitivity) 99 (*)    All other components within normal limits  SARS CORONAVIRUS 2 BY RT PCR (HOSPITAL ORDER, [facility])  HCG, QUANTITATIVE, PREGNANCY  TROPONIN I (HIGH SENSITIVITY)    EKG EKG  Interpretation  Date/Time:  Sunday December 17 2020 17:09:36 EST Ventricular Rate:  92 PR Interval:  148 QRS Duration: 101 QT Interval:  398 QTC Calculation: 493 R Axis:   19 Text Interpretation: Sinus rhythm Inferior infarct, acute (RCA) Probable RV involvement, suggest recording right precordial leads >>> Acute MI <<< Confirmed by Dene Gentry [phone]) on 12/17/2020 5:28:04 PM   Radiology DG  Chest 2 View  Result Date: 12/17/2020 CLINICAL DATA:  Chest pain. EXAM: CHEST - 2 VIEW COMPARISON:  December 11, 2020 FINDINGS: The heart size and mediastinal contours are within normal limits. The lung volumes are low. Mild increased pulmonary interstitium is identified bilaterally. There is no pleural effusion. Mild patchy opacity of left lung base is noted. The visualized skeletal structures are unremarkable. IMPRESSION: 1. Mild increased pulmonary interstitium bilaterally. This can be seen in viral infection or mild interstitial edema. 2. Mild patchy opacity of left lung base, developing pneumonia is not excluded. Electronically Signed   By: Abelardo Diesel M.D.   On: 12/17/2020 16:28    Procedures Procedures (including critical care time)  CRITICAL CARE Performed by: Valarie Merino   Total critical care time: 30 minutes  Critical care time was exclusive of separately billable procedures and treating other patients.  Critical care was necessary to treat or prevent imminent or life-threatening deterioration.  Critical care was time spent personally by me on the following activities: development of treatment plan with patient and/or surrogate as well as nursing, discussions with consultants, evaluation of patient's response to treatment, examination of patient, obtaining history from patient or surrogate, ordering and performing treatments and interventions, ordering and review of laboratory studies, ordering and review of radiographic studies, pulse oximetry and re-evaluation of patient's  condition.     Medications Ordered in ED Medications  nitroGLYCERIN 50 mg in dextrose 5 % 250 mL (0.2 mg/mL) infusion (5 mcg/min Intravenous New Bag/Given 12/17/20 1729)  aspirin chewable tablet 324 mg (has no administration in time range)  ondansetron (ZOFRAN) injection 4 mg (4 mg Intravenous Given 12/17/20 1727)  morphine 4 MG/ML injection 4 mg (4 mg Intravenous Given 12/17/20 1723)    ED Course  I have reviewed the triage vital signs and the nursing notes.  Pertinent labs & imaging results that were available during my care of the patient were reviewed by me and considered in my medical decision making (see chart for details).    MDM Rules/Calculators/A&P                          MDM  Screen complete  Raushanah Osmundson was evaluated in Emergency Department on 12/17/2020 for the symptoms described in the history of present illness. She was evaluated in the context of the global COVID-19 pandemic, which necessitated consideration that the patient might be at risk for infection with the SARS-CoV-2 virus that causes COVID-19. Institutional protocols and algorithms that pertain to the evaluation of patients at risk for COVID-19 are in a state of rapid change based on information released by regulatory bodies including the CDC and federal and state organizations. These policies and algorithms were followed during the patient's care in the ED.  Patient is presenting with complaint of substernal chest discomfort (10/10).  Patient reports onset of pain 2 hours prior to arrival.  She is noted to be hypertensive.  She reports full compliance with all medications.  Patient with recent catheterization in [facility].  She reports stenting of the RCA. She reports prior history 6 cardiac stents.   STEMI activation occurred when patient was placed in room (patient removed from lobby to ED exam room after she complained of worsening pain and repeat EKG was obtained).  Repeat EKG suggest evolving acute  inferior infarct.  Trop 99. This is particular concerning given patient's recent reported RCA stent.  Asa, NTG gtt, Heparin, and MSO4 given in ED.  Of note, patient with Covid screen positive on 1/17.  Cardiology/STEMI team made aware.  Patient taken to cardiac cath.  Patient understands plan of care.      Final Clinical Impression(s) / ED Diagnoses Final diagnoses:  Chest pain, unspecified type    Rx / DC Orders ED Discharge Orders    None       Valarie Merino, MD 12/17/20 1807

## 2020-12-17 NOTE — ED Triage Notes (Signed)
C/O chest pain ; unrelieved by NTG. Reported stents placed 3 days ago. Patient in triage; tachyonic. Awake, but having difficulty speaking,

## 2020-12-17 NOTE — ED Notes (Signed)
Pt states she took 324 ASA at home

## 2020-12-17 NOTE — ED Notes (Signed)
Dra (Friend#[phone]) called/would like a call back from patient.  Thank u

## 2020-12-17 NOTE — Brief Op Note (Signed)
BRIEF CARDIAC CATHETERIZATION NOTE  DATE: 12/17/2020  TIME: 7:41 PM  PATIENT:  Katie Pearson  40 y.o. female  PRE-OPERATIVE DIAGNOSIS:  STEMI  POST-OPERATIVE DIAGNOSIS:  STEMI  PROCEDURE:  Procedure(s): Coronary/Graft Acute MI Revascularization (N/A) LEFT HEART CATH AND CORONARY ANGIOGRAPHY (N/A)  SURGEON:  Surgeon(s) and Role:    * Humbert Morozov, MD - Primary  FINDINGS: 1. Multivessel CAD, including 70% mid LAD ISR, 60% mid LCx disease, 80% proximal/mid RCA, and thrombosis of recently placed distal RCA stent involving rPDA and rPLA. 2. Moderately elevated left ventricular filling pressure (LVEDP 25-30 mmHg). 3. Successful IVUS-guided PTCA to distal RCA, rPDA, and rPLA. 4. Successful PCI to proximal/mid RCA using Resolute Onyx 3.5 x 22 mm DES.  RECOMMENDATIONS: 1. Ticagrelor x 6 hours. 2. DAPT with ASA and ticagrelor. 3. Aggressive secondary prevention.  Nelva Bush, MD [facility]

## 2020-12-18 ENCOUNTER — Other Ambulatory Visit: Payer: Self-pay

## 2020-12-18 ENCOUNTER — Encounter (HOSPITAL_COMMUNITY): Payer: Self-pay | Admitting: Internal Medicine

## 2020-12-18 ENCOUNTER — Inpatient Hospital Stay (HOSPITAL_COMMUNITY): Payer: Medicaid Other

## 2020-12-18 DIAGNOSIS — I2111 ST elevation (STEMI) myocardial infarction involving right coronary artery: Secondary | ICD-10-CM | POA: Diagnosis not present

## 2020-12-18 LAB — BASIC METABOLIC PANEL
Anion gap: 11 (ref 5–15)
BUN: 8 mg/dL (ref 6–20)
CO2: 22 mmol/L (ref 22–32)
Calcium: 8.9 mg/dL (ref 8.9–10.3)
Chloride: 105 mmol/L (ref 98–111)
Creatinine, Ser: 0.69 mg/dL (ref 0.44–1.00)
GFR, Estimated: >60 mL/min (ref >60)
Glucose, Bld: 119 mg/dL — ABNORMAL HIGH (ref 70–99)
Potassium: 3.1 mmol/L — ABNORMAL LOW (ref 3.5–5.1)
Sodium: 138 mmol/L (ref 135–145)

## 2020-12-18 LAB — ECHOCARDIOGRAM LIMITED
Area-P 1/2: 2.95 cm2
Height: 65 in
S' Lateral: 2.9 cm
Weight: 4218.72 oz

## 2020-12-18 LAB — TROPONIN I (HIGH SENSITIVITY)
Troponin I (High Sensitivity): >27000 ng/L (ref <18)
Troponin I (High Sensitivity): 16804 ng/L (ref <18)

## 2020-12-18 MED ORDER — CHLORHEXIDINE GLUCONATE CLOTH 2 % EX PADS: 6.0000 | MEDICATED_PAD | Freq: Every day | CUTANEOUS | Status: DC

## 2020-12-18 MED ORDER — COVID-19 MRNA VAC-TRIS(PFIZER) 30 MCG/0.3ML IM SUSP
0.3000 mL | Freq: Once | INTRAMUSCULAR | Status: DC
  Filled 2020-12-18: qty 0.3

## 2020-12-18 NOTE — Progress Notes (Signed)
Progress Note  Patient Name: Katie Pearson Date of Encounter: 12/18/2020  [facility] Cardiologist: No primary care provider on file.   Subjective   No chest pain. No events overnight  Inpatient Medications    Scheduled Meds: . aspirin EC  81 mg Oral Daily  . atorvastatin  80 mg Oral Daily  . Chlorhexidine Gluconate Cloth  6 each Topical Daily  . enoxaparin (LOVENOX) injection  40 mg Subcutaneous Q24H  . metoprolol tartrate  25 mg Oral BID  . sodium chloride flush  3 mL Intravenous Q12H  . ticagrelor  90 mg Oral Q12H   Continuous Infusions: . sodium chloride     PRN Meds: sodium chloride, acetaminophen, ALPRAZolam, ondansetron (ZOFRAN) IV, sodium chloride flush   Vital Signs    Vitals:   12/18/20 0300 12/18/20 0400 12/18/20 0500 12/18/20 0600  BP: (!) 141/77 121/79 126/79 130/81  Pulse:      Resp: 15 17 (!) 21 18  Temp:      TempSrc:      SpO2: 100% 100% 100% 100%  Weight:    119.6 kg  Height:        Intake/Output Summary (Last 24 hours) at 12/18/2020 0730 Last data filed at 12/18/2020 0500 Gross per 24 hour  Intake 200.82 ml  Output 1750 ml  Net -1549.18 ml   Last 3 Weights 12/18/2020 12/17/2020 03/27/2020  Weight (lbs) 263 lb 10.7 oz 260 lb 260 lb  Weight (kg) 119.6 kg 117.935 kg 117.935 kg      Telemetry    Sinus - Personally Reviewed  ECG     Sinus, inferior Q waves - Personally Reviewed  Physical Exam   GEN: No acute distress.   Neck: No JVD Cardiac: RRR, no murmurs, rubs, or gallops.  Respiratory: Clear to auscultation bilaterally. GI: Soft, nontender, non-distended  MS: No edema; No deformity. Neuro:  Nonfocal  Psych: Normal affect   Labs    High Sensitivity Troponin:   Recent Labs  Lab 12/17/20 1558 12/17/20 1737 12/17/20 2225  TROPONINIHS 99* 259* >27,000*      Chemistry Recent Labs  Lab 12/17/20 1558  NA 137  K 4.4  CL 105  CO2 17*  GLUCOSE 190*  BUN 13  CREATININE 0.75  CALCIUM 8.8*  GFRNONAA >60  ANIONGAP 15      Hematology Recent Labs  Lab 12/17/20 1558  WBC 9.6  RBC 3.70*  HGB 11.5*  HCT 33.7*  MCV 91.1  MCH 31.1  MCHC 34.1  RDW 15.4  PLT 183    BNPNo results for input(s): BNP, PROBNP in the last 168 hours.   DDimer No results for input(s): DDIMER in the last 168 hours.   Radiology    DG Chest 2 View  Result Date: 12/17/2020 CLINICAL DATA:  Chest pain. EXAM: CHEST - 2 VIEW COMPARISON:  December 11, 2020 FINDINGS: The heart size and mediastinal contours are within normal limits. The lung volumes are low. Mild increased pulmonary interstitium is identified bilaterally. There is no pleural effusion. Mild patchy opacity of left lung base is noted. The visualized skeletal structures are unremarkable. IMPRESSION: 1. Mild increased pulmonary interstitium bilaterally. This can be seen in viral infection or mild interstitial edema. 2. Mild patchy opacity of left lung base, developing pneumonia is not excluded. Electronically Signed   By: Abelardo Diesel M.D.   On: 12/17/2020 16:28   CARDIAC CATHETERIZATION  Result Date: 12/17/2020 Conclusions: 1. Multivessel coronary artery disease, including 70% mid LAD in-stent restenosis, 60% mid  LCx disease, 80% proximal/mid RCA stenosis, and thrombosis of recently placed distal RCA stent involving rPDA and rPLA. 2. Moderately elevated left ventricular filling pressure (LVEDP 25-30 mmHg). 3. Successful IVUS-guided angioplasty to distal RCA, rPDA, and RPLa with reestablishment of TIMI-3 flow and improved expansion of distal RCA stent. 4. Successful PCI to proximal/mid RCA using Resolute Onyx 3.5 x 22 mm drug-eluting stent with 0% residual stenosis and TIMI-3 flow.  Recommendations: 1. Continue tirofiban infusion for 6 hours. 2. Dual antiplatelet therapy with aspirin and ticagrelor for at least 12 months, ideally longer 3. Aggressive secondary prevention. 4. Favor medical therapy of LAD and LCx disease unless the patient has refractory symptoms. Nelva Bush, MD  [facility]   Cardiac Studies     Patient Profile     40 y.o. female with history of CAD, DM, HTN, Covid positive, prior tobacco abuse, prior DVT admitted with inferior STEMI secondary to thrombotic occlusion of a recently placed distal RCA stent due to underexpansion treated with balloon angioplasty and also with treatment of a severe proximal RCA stenosis with a drug eluting stent.   Assessment & Plan    1. CAD/Inferior STEMI: Doing well this am without chest pain. S/p balloon angioplasty distal RCA stent which was occluded and placement of proximal RCA stent.  -Continue ASA/Brilinta, high intensity statin and beta blocker.  -Echo pending today.  -Medical therapy for LAD and Circumflex disease.   2. Recent Covid infection: Isolation precautions in place. Positive test 12/11/20.   3. DM: SSI. Recent A1C 6.3 on 12/11/20.   4. HTN: BP controlled  Transfer to telemetry unit today. Likely d/c home tomorrow.   For questions or updates, please contact [place] Please consult [email or web address] for contact info under        Signed, Lauree Chandler, MD  12/18/2020, 7:30 AM

## 2020-12-18 NOTE — Progress Notes (Addendum)
   12/18/20 1612  Vitals  Temp 98.1 F (36.7 C)  Temp Source Oral  BP 121/79  MAP (mmHg) 92  BP Location Left Arm  BP Method Automatic  Patient Position (if appropriate) Sitting  Pulse Rate 68  Pulse Rate Source Monitor  Resp 18  Level of Consciousness  Level of Consciousness Alert  MEWS COLOR  MEWS Score Color Green  Oxygen Therapy  SpO2 100 %  O2 Device Room Air   Admit. Updated on plan of care.

## 2020-12-18 NOTE — Progress Notes (Signed)
  Echocardiogram 2D Echocardiogram has been performed.  Katie Pearson 12/18/2020, 8:41 AM

## 2020-12-18 NOTE — H&P (Signed)
Cardiology History & Physical    Patient ID: Katie Pearson MRN: [id number], DOB/AGE: 1981/02/10   Admit date: 12/17/2020  Primary Physician: Rudene Anda, MD Primary Cardiologist: No primary care provider on file.  Patient Profile    Katie Pearson is a 40 year old woman with a history of CAD with multiple prior interventions, including PCI of distal RCA bifurcation 2 days ago for NSTEMI (in-stent). At that time she was also positive for SARS-CoV2, asymptomatic and not requiring treatment. She also has a history of HTN, type 2 DM, HLD, smoking (quit 2019), DVT in 2017 ([place]), and obesity. She presented today for evaluation of chest pain and was found to have inferior ST elevation on her ECG.  History of Present Illness    Katie Pearson reportedly had a total of 5 stents in July and October 2016 for MIs in [place] (LAD, RCA, RPL, PDA), then NSTEMI in 2019 with thrombotic LAD occlusion just proximal to the prior mid-LAD stent. EF remained preserved. She was just recently admitted a few days ago on 1/17 with NSTEMI after presenting with worsening chest pain. She was found to have occlusion of the distal RCA bifurcation at the previously placed stents treated with 2.75x18 DES in distal RCA to ostial PDA with PCTA of the PL branch. EF was 50-55%. She was restarted on Brilinta. Of note, she also tested positive for SARS-CoV2 and is unvaccinated. She was not hypoxic and did not meet criteria for any specific treatment.   Today, she reports sudden onset of severe central chest pain around 2pm that has been ongoing and is associated with nausea. Pain does not radiate and has been constant. She reports compliance with medications repeatedly, including Brilinta, but then when asked specifically if she took her dose this morning she says she did not and on review of pill bottles it does not seem that she has the prescription.   Her ECG showed borderline ST elevation inferiorly and significant ST  depression in lateral and septal leads, worsening on serial ECGs. CODE STEMI was activated and she was taken emergently to the cath lab where she was found to have multivessel disease, including 70% mid LAD ISR, 60% mid LCx disease, 80% proximal/mid RCA, and thrombosis of recently placed distal RCA stent involving rPDA and rPLA. With IVUS, the recently placed distal RCA/PDA stent was significant underexpanded and treated with 3.25 balloon of proximal-mid stent. The prox/mid RCA lesion was also treated with 3.5 x 22 DES with good result. She remained hemodynamically stable and was chest pain free at the end of the case. Started on tirofiban and loaded with ticagrelor.    Past Medical History   Past Medical History:  Diagnosis Date  . Back pain   . Hypertension   . MI (myocardial infarction) ([place])   . Obesity   . Pulmonary emboli [facility])     Past Surgical History:  Procedure Laterality Date  . CESAREAN SECTION    . CORONARY ANGIOPLASTY WITH STENT PLACEMENT    . LAMINECTOMY    . TUBAL LIGATION       Allergies Allergies  Allergen Reactions  . Ace Inhibitors Swelling    .   Marland Kitchen Codeine Nausea And Vomiting  . Latex Itching and Rash  . Tape Itching and Rash    Home Medications    Prior to Admission medications   Medication Sig Start Date End Date Taking? Authorizing Provider  amLODipine (NORVASC) 10 MG tablet Take 1 tablet (10 mg total) by  mouth daily. 09/11/18 10/11/18  Molpus, John, MD  aspirin EC 81 MG tablet Take by mouth.    [provider]  atorvastatin (LIPITOR) 10 MG tablet Take 1 tablet (10 mg total) by mouth daily. 09/11/18 10/11/18  Molpus, John, MD  lidocaine (LIDODERM) 5 % Place 1 patch onto the skin daily. Remove & Discard patch within 12 hours or as directed by MD 03/27/20   Eustaquio Maize, PA-C  Lidocaine, Anorectal, 5 % CREA Apply to painful lesions as needed. 10/05/19   Molpus, John, MD  losartan (COZAAR) 100 MG tablet Take by mouth. 04/13/19 07/12/19  [provider]  methylPREDNISolone (MEDROL DOSEPAK) 4 MG TBPK tablet Follow package insert for instructions 03/27/20   Alroy Bailiff, Margaux, PA-C  metoprolol tartrate (LOPRESSOR) 25 MG tablet Take by mouth. 04/13/19 07/12/19  [provider]  metroNIDAZOLE (FLAGYL) 500 MG tablet Take 1 tablet (500 mg total) by mouth 2 (two) times daily. One po bid x 7 days 10/05/19   Molpus, John, MD  ondansetron (ZOFRAN) 4 MG tablet Take 1 tablet (4 mg total) by mouth every 8 (eight) hours as needed for up to 12 doses for nausea or vomiting. 05/19/19   Lennice Sites, DO  ticagrelor (BRILINTA) 90 MG TABS tablet Take 90 mg by mouth 2 (two) times daily.    [provider]    Family History    No family history on file. has no family status information on file.    Social History    Social History   Socioeconomic History  . Marital status: Single    Spouse name: Not on file  . Number of children: Not on file  . Years of education: Not on file  . Highest education level: Not on file  Occupational History  . Not on file  Tobacco Use  . Smoking status: Former Smoker    Packs/day: 0.25    Quit date: 10/30/2015    Years since quitting: 5.1  . Smokeless tobacco: Never Used  Vaping Use  . Vaping Use: Never used  Substance and Sexual Activity  . Alcohol use: Yes    Comment: occ  . Drug use: No  . Sexual activity: Not on file  Other Topics Concern  . Not on file  Social History Narrative  . Not on file   Social Determinants of Health   Financial Resource Strain: Not on file  Food Insecurity: Not on file  Transportation Needs: Not on file  Physical Activity: Not on file  Stress: Not on file  Social Connections: Not on file  Intimate Partner Violence: Not on file     Review of Systems    A comprehensive ROS was obtained with pertinent positives and negatives noted in the HPI  Physical Exam    BP (!) 167/97   Pulse 91   Temp 97.9 F (36.6 C) (Oral)   Resp (!) 32   Ht 5\' 5"   (1.651 m)   Wt 117.9 kg   SpO2 100%   BMI 43.27 kg/m  General: Alert, uncomfortable appearing, morbidly obese HEENT: Normal Neck: No bruits or JVD. Lungs:  Resp regular and unlabored, CTA bilaterally. Heart: Regular rhythm, no s3, s4, or murmurs. Abdomen: Soft, non-tender, non-distended, BS +.  Extremities: Warm. No clubbing, cyanosis or edema. Radials pulses 2+ and equal bilaterally. Neuro: Alert and oriented. No gross focal deficits. No abnormal movements.  Labs    Cardiac Panel (last 3 results) Recent Labs    12/17/20 1558 12/17/20 1737 12/17/20 2225  TROPONINIHS 99* 259* >27,000*    Lab Results  Component Value Date   WBC 9.6 12/17/2020   HGB 11.5 (L) 12/17/2020   HCT 33.7 (L) 12/17/2020   MCV 91.1 12/17/2020   PLT 183 12/17/2020    Recent Labs  Lab 12/17/20 1558  NA 137  K 4.4  CL 105  CO2 17*  BUN 13  CREATININE 0.75  CALCIUM 8.8*  GLUCOSE 190*   No results found for: CHOL, HDL, LDLCALC, TRIG Lab Results  Component Value Date   DDIMER 1.16 (H) 07/28/2016     Radiology Studies    DG Chest 2 View  Result Date: 12/17/2020 CLINICAL DATA:  Chest pain. EXAM: CHEST - 2 VIEW COMPARISON:  December 11, 2020 FINDINGS: The heart size and mediastinal contours are within normal limits. The lung volumes are low. Mild increased pulmonary interstitium is identified bilaterally. There is no pleural effusion. Mild patchy opacity of left lung base is noted. The visualized skeletal structures are unremarkable. IMPRESSION: 1. Mild increased pulmonary interstitium bilaterally. This can be seen in viral infection or mild interstitial edema. 2. Mild patchy opacity of left lung base, developing pneumonia is not excluded. Electronically Signed   By: Abelardo Diesel M.D.   On: 12/17/2020 16:28    ECG & Cardiac Imaging    ECG: sinus rhythm, LVH, PRWP, borderline inferior ST elevation, ST depression in V2 and lateral leads, suspected posterior/inferior STEMI - personally  reviewed.  Assessment & Plan    1. CAD with Inferior STEMI - Multiple CAD risk factors with known multivessel disease and multiple prior PCIs - Recent inferior NSTEMI with placement of under-expanded distal RCA/PDA stent - Also suspect missed doses of ticagrelor, poor health literacy probably contributing - Residual borderline LAD and LCx disease, will medically manage - Update TTE, lipid profile, A1c - Aggrastat for 6 hours - ASA and Brilinta for at least 1 year, consider long-term DAPT - Increase atorvastatin to 80mg  - Continue home metoprolol - Restart losartan 25mg  tomorrow if Cr stable - Needs aggressive risk factor modification, including weight loss   2. Recent SARS-CoV2 infection:  - Seems to be asymptomatic.Tested positive 12/11/20 with CRP < 5. No hypoxia. - No specific treatment indicated - Requires continued isolation precautions   3. Type 2 diabetes: - Recent A1c was 6.3% (12/11/2020), not on medication - SSI only and monitor  4. Hypertension: - Currently normotensive on metoprolol only - Other home meds being held, consider restarting losartan 25mg  in AM - Restart amlodipine if needed.   Nutrition: Heart healthy diet DVT ppx: Lovenox Advanced Care Planning: Full Code   Signed, Marykay Lex, MD 12/17/2020, 8:14 PM

## 2020-12-18 NOTE — Progress Notes (Signed)
CARDIAC REHAB PHASE I   Left materials with pts nurse, and ed completed via phone due to precautions. Pt educated on importance of ASA and Brilinta. Reviewed restrictions, site care, and exercise guidelines. Heart healthy and diabetic diets with MI book delivered to room. Pt denies further questions or concerns at this time. Will f/u tomorrow.  [employer], RN BSN 12/18/2020 3:10 PM

## 2020-12-18 NOTE — Plan of Care (Signed)

## 2020-12-18 NOTE — Progress Notes (Signed)
Report called to receiving Rn [id number]. Will transfer to room via [place]. Patient with no complaints at the current time.

## 2020-12-19 DIAGNOSIS — I2111 ST elevation (STEMI) myocardial infarction involving right coronary artery: Secondary | ICD-10-CM | POA: Diagnosis not present

## 2020-12-19 DIAGNOSIS — E785 Hyperlipidemia, unspecified: Secondary | ICD-10-CM

## 2020-12-19 DIAGNOSIS — I10 Essential (primary) hypertension: Secondary | ICD-10-CM

## 2020-12-19 DIAGNOSIS — E118 Type 2 diabetes mellitus with unspecified complications: Secondary | ICD-10-CM

## 2020-12-19 LAB — BASIC METABOLIC PANEL
Anion gap: 11 (ref 5–15)
BUN: 12 mg/dL (ref 6–20)
CO2: 22 mmol/L (ref 22–32)
Calcium: 8.9 mg/dL (ref 8.9–10.3)
Chloride: 103 mmol/L (ref 98–111)
Creatinine, Ser: 0.7 mg/dL (ref 0.44–1.00)
GFR, Estimated: >60 mL/min (ref >60)
Glucose, Bld: 98 mg/dL (ref 70–99)
Potassium: 3.3 mmol/L — ABNORMAL LOW (ref 3.5–5.1)
Sodium: 136 mmol/L (ref 135–145)

## 2020-12-19 LAB — CBC
HCT: 32.8 % — ABNORMAL LOW (ref 36.0–46.0)
Hemoglobin: 10.5 g/dL — ABNORMAL LOW (ref 12.0–15.0)
MCH: 29.6 pg (ref 26.0–34.0)
MCHC: 32 g/dL (ref 30.0–36.0)
MCV: 92.4 fL (ref 80.0–100.0)
Platelets: 336 10*3/uL (ref 150–400)
RBC: 3.55 MIL/uL — ABNORMAL LOW (ref 3.87–5.11)
RDW: 15.3 % (ref 11.5–15.5)
WBC: 6.7 10*3/uL (ref 4.0–10.5)
nRBC: 0 % (ref 0.0–0.2)

## 2020-12-19 LAB — POCT ACTIVATED CLOTTING TIME
Activated Clotting Time: 404 seconds
Activated Clotting Time: 297 seconds

## 2020-12-19 MED ORDER — METOPROLOL TARTRATE 25 MG PO TABS: 25.0000 mg | ORAL_TABLET | Freq: Two times a day (BID) | ORAL | 1 refills | Status: AC

## 2020-12-19 MED ORDER — TICAGRELOR 90 MG PO TABS
90.0000 mg | ORAL_TABLET | Freq: Two times a day (BID) | ORAL | 2 refills | Status: AC
Start: 2020-12-19 — End: ?

## 2020-12-19 MED ORDER — ATORVASTATIN CALCIUM 80 MG PO TABS: 80.0000 mg | ORAL_TABLET | Freq: Every day | ORAL | 1 refills | Status: DC

## 2020-12-19 MED ORDER — NITROGLYCERIN 0.4 MG SL SUBL: 0.4000 mg | SUBLINGUAL_TABLET | SUBLINGUAL | 2 refills | Status: AC | PRN

## 2020-12-19 MED ORDER — POTASSIUM CHLORIDE CRYS ER 20 MEQ PO TBCR
40.0000 meq | EXTENDED_RELEASE_TABLET | Freq: Once | ORAL | Status: AC
  Administered 2020-12-19: 40 meq via ORAL
  Filled 2020-12-19: qty 2

## 2020-12-19 MED ORDER — ATORVASTATIN CALCIUM 80 MG PO TABS: 80.0000 mg | ORAL_TABLET | Freq: Every day | ORAL | 1 refills | Status: AC

## 2020-12-19 MED FILL — ATORVASTATIN CALCIUM 80 MG: 80 | 30 days supply | Qty: 30 | Fill #0

## 2020-12-19 MED FILL — METOPROLOL TARTRATE 25 MG T: 25 | 30 days supply | Qty: 60 | Fill #0

## 2020-12-19 MED FILL — BRILINTA 90 MG TABLET: 90 | 30 days supply | Qty: 60 | Fill #0

## 2020-12-19 MED FILL — NITROGLYCERIN 0.4 MG TAB SL: 0.4 | 14 days supply | Qty: 25 | Fill #0

## 2020-12-19 NOTE — Progress Notes (Signed)
Katie Pearson D/C'd home per MD order. D/C instructions discussed with the patient and all questions answered.  An After Visit Summary was printed and given to the patient.  Patient escorted via [place], and D/C home via private auto. Patient belongings and valuables sent with patient.   Addendum: RN instructed patient to wait for medications prior to leaving for DC. When I went back to check her room patient had left. Shortly after pharmacy delivered patients meds to the unit. Pharmacy will call the patient to pick up meds  [name]  12/19/2020 2:14 PM

## 2020-12-19 NOTE — TOC Benefit Eligibility Note (Signed)
Transition of Care [facility]) Benefit Eligibility Note    Patient Details  Name: Katie Pearson MRN: [id number] Date of Birth: 30-Dec-1980   Medication/Dose: Brilinta 90 mg. bid  for 30 day supply  Covered?: Yes  Tier:  (tier 1)  Prescription Coverage Preferred [facility]  Spoke with Person/Company/Phone Number:: Metta Clines. W/[employer] PH# [phone]  Co-Pay: $3.00  Prior Approval: No  Deductible:  (no deductible on this plan)       Shelda Altes Phone Number: 12/19/2020, 10:34 AM

## 2020-12-19 NOTE — Progress Notes (Signed)
Pt refused 2AM labs. Phlebotomy will revisit at 7AM for labs.

## 2020-12-19 NOTE — TOC Progression Note (Signed)
Transition of Care [facility]) - Progression Note    Patient Details  Name: Brigette Hopfer MRN: [id number] Date of Birth: 1981-03-03  Transition of [facility]) CM/SW Contact  Carles Collet, RN Phone Number: 12/19/2020, 11:10 AM  Clinical Narrative:   Damaris Schooner w patient over the phone. He states that she has been on Brilinta in the past and that it was covered then by the [facility] insurance that she still currently has.          Expected Discharge Plan and Services                                                 Social Determinants of Health (SDOH) Interventions    Readmission Risk Interventions No flowsheet data found.

## 2020-12-19 NOTE — Progress Notes (Addendum)
   12/19/20 1120  Clinical Encounter Type  Visited With Patient Media planner with patient on the phone)  Visit Type Initial  Referral From Nurse  Consult/Referral To Chaplain  Chaplain responded to consult. The patient is requesting an Scientist, physiological. Chaplain explained due to [place] restrictions witnesses (hospital volunteers) are not allowed in the room. The chaplain educated the patient and informed her that she could get her documents notarized once she was discharged. Patient stated she is hoping to be discharged today.  This note was prepared by Jeanine Luz, M.Div..  For questions please contact by phone [phone].

## 2020-12-19 NOTE — Progress Notes (Signed)
CARDIAC REHAB PHASE I   Spoke with pt via room phone. Pt states she has received MI book along with diets. Reinforced importance of ASA and Brilinta, along with restrictions and exercise guidelines. Pt denies questions or concerns at this time. Anxious to go home. Pt referred to CRP [facility] [place].  [id number] Rufina Falco, RN BSN 12/19/2020 10:36 AM

## 2020-12-19 NOTE — Discharge Summary (Signed)
Discharge Summary    Patient ID: Dimon Zaiger MRN: [id number]; DOB: 1981/07/21  Admit date: 12/17/2020 Discharge date: 12/19/2020  Primary Care Provider: Rudene Anda, MD  Primary Cardiologist: Lauree Chandler, MD  Primary Electrophysiologist:  None   Discharge Diagnoses    Principal Problem:   STEMI (ST elevation myocardial infarction) [facility]) Active Problems:   STEMI involving right coronary artery ([place])   Hyperlipidemia   Hypertension   Type 2 diabetes mellitus with complication, without long-term current use of insulin [facility])    Diagnostic Studies/Procedures    Cath: 12/17/20  Conclusions: 1. Multivessel coronary artery disease, including 70% mid LAD in-stent restenosis, 60% mid LCx disease, 80% proximal/mid RCA stenosis, and thrombosis of recently placed distal RCA stent involving rPDA and rPLA. 2. Moderately elevated left ventricular filling pressure (LVEDP 25-30 mmHg). 3. Successful IVUS-guided angioplasty to distal RCA, rPDA, and RPLa with reestablishment of TIMI-3 flow and improved expansion of distal RCA stent. 4. Successful PCI to proximal/mid RCA using Resolute Onyx 3.5 x 22 mm drug-eluting stent with 0% residual stenosis and TIMI-3 flow.  Recommendations: 1. Continue tirofiban infusion for 6 hours. 2. Dual antiplatelet therapy with aspirin and ticagrelor for at least 12 months, ideally longer 3. Aggressive secondary prevention. 4. Favor medical therapy of LAD and LCx disease unless the patient has refractory symptoms.  Nelva Bush, MD [facility]   Diagnostic Dominance: Right    Intervention     Echo: 12/18/20  IMPRESSIONS    1. Left ventricular ejection fraction, by estimation, is 60 to 65%. The  left ventricle has normal function. The left ventricle has no regional  wall motion abnormalities. There is mild concentric left ventricular  hypertrophy. Left ventricular diastolic  parameters are consistent with Grade I diastolic  dysfunction (impaired  relaxation).  2. Right ventricular systolic function is normal. The right ventricular  size is normal. There is normal pulmonary artery systolic pressure.  3. The mitral valve is normal in structure. Trivial mitral valve  regurgitation. No evidence of mitral stenosis.  4. The aortic valve is normal in structure. Aortic valve regurgitation is  trivial. No aortic stenosis is present.  5. The inferior vena cava is normal in size with greater than 50%  respiratory variability, suggesting right atrial pressure of 3 mmHg.  _____________   History of Present Illness     Jameela Schmucker is a 40 y.o. female with a history of CAD with multiple prior interventions, including PCI of distal RCA bifurcation 2 days ago for NSTEMI (in-stent). At that time she was also positive for SARS-CoV2, asymptomatic and not requiring treatment. She also has a history of HTN, type 2 DM, HLD, smoking (quit 2019), DVT in 2017 (off [place]), and obesity. She presented for evaluation of chest pain and was found to have inferior ST elevation on her ECG.  Ms. Berendsen reportedly had a total of 5 stents in July and October 2016 for MIs in [place] (LAD, RCA, RPL, PDA), then NSTEMI in 2019 with thrombotic LAD occlusion just proximal to the prior mid-LAD stent. EF remained preserved. She was just recently admitted a few days ago on 1/17 with NSTEMI after presenting with worsening chest pain. She was found to have occlusion of the distal RCA bifurcation at the previously placed stents treated with 2.75x18 DES in distal RCA to ostial PDA with PCTA of the PL branch. EF was 50-55%. She was restarted on Brilinta. Of note, she also tested positive for SARS-CoV2 and is unvaccinated. She was not hypoxic and did  not meet criteria for any specific treatment.   The day of admission, she reported sudden onset of severe central chest pain around 2pm that has been ongoing and is associated with nausea. Pain did not radiate  and had been constant. She reported compliance with medications repeatedly, including Brilinta, but then when asked specifically if she took her dose that morning she said she did not and on review of pill bottles it does not seem that she has the prescription.   Her ECG showed borderline ST elevation inferiorly and significant ST depression in lateral and septal leads, worsening on serial ECGs. CODE STEMI was activated and she was taken emergently to the cath lab where she was found to have multivessel disease, including 70% mid LAD ISR, 60% mid LCx disease, 80% proximal/mid RCA, and thrombosis of recently placed distal RCA stent involving rPDA and rPLA. With IVUS, the recently placed distal RCA/PDA stent was significant underexpanded and treated with 3.25 balloon of proximal-mid stent. The prox/mid RCA lesion was also treated with 3.5 x 22 DES with good result. She remained hemodynamically stable and was chest pain free at the end of the case. Started on tirofiban and loaded with ticagrelor.   Hospital Course     1. CAD/Inferior STEMI: Admitted with inferior STEMI.  S/p balloon angioplasty distal RCA stent which was occluded and placement of proximal RCA stent. Does not appear that she was taking her Brilinta prior to admission. No further chest pain post cath.  -- continue on ASA, Brilinta, statin and beta blocker. LVEF=60-65% by echo yesterday. Will plan medical therapy for LAD and Circumflex disease.   2. Recent Covid infection: Isolation precautions in place. Positive test 12/11/20. She is to continue on quarantine until 1/28.   3. DM: Recent HgbA1c 6.3 -- instructed to follow up with PCP as an outpatient for further management  4. HTN: BP is well controlled on metoprolol 25mg  BID during admission. Will resume losartan at discharge.  -- instructed to follow her BP at discharge. If needed can add back prior home meds.  5. Hypokalemia: Supplemented prior to discharge.   6. HLD: home  atorvastatin was increased from 10mg  daily to 80mg  daily -- FLP/LFTs in 8 weeks   Did the patient have an acute coronary syndrome (MI, NSTEMI, STEMI, etc) this admission?:  Yes                               AHA/ACC Clinical Performance & Quality Measures: 5. Aspirin prescribed? - Yes 6. ADP Receptor Inhibitor (Plavix/Clopidogrel, Brilinta/Ticagrelor or Effient/Prasugrel) prescribed (includes medically managed patients)? - Yes 7. Beta Blocker prescribed? - Yes 8. High Intensity Statin (Lipitor 40-80mg  or Crestor 20-40mg ) prescribed? - Yes 9. EF assessed during THIS hospitalization? - Yes 10. For EF <40%, was ACEI/ARB prescribed? - Not Applicable (EF >/= 72%) 11. For EF <40%, Aldosterone Antagonist (Spironolactone or Eplerenone) prescribed? - Not Applicable (EF >/= 53%) 12. Cardiac Rehab Phase II ordered (including medically managed patients)? - Yes       _____________  Discharge Vitals Blood pressure 119/75, pulse 68, temperature 98.1 F (36.7 C), temperature source Oral, resp. rate 20, height 5\' 5"  (1.651 m), weight 119.6 kg, SpO2 97 %.  Filed Weights   12/17/20 1750 12/18/20 0600  Weight: 117.9 kg 119.6 kg    Labs & Radiologic Studies    CBC Recent Labs    12/17/20 1558 12/19/20 0705  WBC 9.6 6.7  HGB 11.5*  10.5*  HCT 33.7* 32.8*  MCV 91.1 92.4  PLT 183 123456   Basic Metabolic Panel Recent Labs    12/18/20 0651 12/19/20 0705  NA 138 136  K 3.1* 3.3*  CL 105 103  CO2 22 22  GLUCOSE 119* 98  BUN 8 12  CREATININE 0.69 0.70  CALCIUM 8.9 8.9   Liver Function Tests No results for input(s): AST, ALT, ALKPHOS, BILITOT, PROT, ALBUMIN in the last 72 hours. No results for input(s): LIPASE, AMYLASE in the last 72 hours. High Sensitivity Troponin:   Recent Labs  Lab 12/17/20 1558 12/17/20 1737 12/17/20 2225 12/18/20 0651 12/18/20 1242  TROPONINIHS 99* 259* >27,000* >27,000* 16,804*    BNP Invalid input(s): POCBNP D-Dimer No results for input(s): DDIMER in the  last 72 hours. Hemoglobin A1C No results for input(s): HGBA1C in the last 72 hours. Fasting Lipid Panel No results for input(s): CHOL, HDL, LDLCALC, TRIG, CHOLHDL, LDLDIRECT in the last 72 hours. Thyroid Function Tests No results for input(s): TSH, T4TOTAL, T3FREE, THYROIDAB in the last 72 hours.  Invalid input(s): FREET3 _____________  DG Chest 2 View  Result Date: 12/17/2020 CLINICAL DATA:  Chest pain. EXAM: CHEST - 2 VIEW COMPARISON:  December 11, 2020 FINDINGS: The heart size and mediastinal contours are within normal limits. The lung volumes are low. Mild increased pulmonary interstitium is identified bilaterally. There is no pleural effusion. Mild patchy opacity of left lung base is noted. The visualized skeletal structures are unremarkable. IMPRESSION: 1. Mild increased pulmonary interstitium bilaterally. This can be seen in viral infection or mild interstitial edema. 2. Mild patchy opacity of left lung base, developing pneumonia is not excluded. Electronically Signed   By: Abelardo Diesel M.D.   On: 12/17/2020 16:28   CARDIAC CATHETERIZATION  Result Date: 12/17/2020 Conclusions: 1. Multivessel coronary artery disease, including 70% mid LAD in-stent restenosis, 60% mid LCx disease, 80% proximal/mid RCA stenosis, and thrombosis of recently placed distal RCA stent involving rPDA and rPLA. 2. Moderately elevated left ventricular filling pressure (LVEDP 25-30 mmHg). 3. Successful IVUS-guided angioplasty to distal RCA, rPDA, and RPLa with reestablishment of TIMI-3 flow and improved expansion of distal RCA stent. 4. Successful PCI to proximal/mid RCA using Resolute Onyx 3.5 x 22 mm drug-eluting stent with 0% residual stenosis and TIMI-3 flow.  Recommendations: 1. Continue tirofiban infusion for 6 hours. 2. Dual antiplatelet therapy with aspirin and ticagrelor for at least 12 months, ideally longer 3. Aggressive secondary prevention. 4. Favor medical therapy of LAD and LCx disease unless the patient  has refractory symptoms. Nelva Bush, MD [facility]  ECHOCARDIOGRAM LIMITED  Result Date: 12/18/2020    ECHOCARDIOGRAM LIMITED REPORT   Patient Name:   [facility] Date of Exam: 12/18/2020 Medical Rec #:  [id number]    Height:       65.0 in Accession #:    [id number]   Weight:       263.7 lb Date of Birth:  [birth year]   BSA:          2.225 m Patient Age:    74 years     BP:           119/71 mmHg Patient Gender: F            HR:           64 bpm. Exam Location:  Inpatient Procedure: Limited Echo, Limited Color Doppler and Cardiac Doppler Indications:    acute myocardial infarction  History:        Patient  has no prior history of Echocardiogram examinations.                 CAD; Covid.  Sonographer:    Johny Chess Referring Phys: [phone] CHRISTOPHER END  Sonographer Comments: Image acquisition challenging due to patient body habitus. IMPRESSIONS  1. Left ventricular ejection fraction, by estimation, is 60 to 65%. The left ventricle has normal function. The left ventricle has no regional wall motion abnormalities. There is mild concentric left ventricular hypertrophy. Left ventricular diastolic parameters are consistent with Grade I diastolic dysfunction (impaired relaxation).  2. Right ventricular systolic function is normal. The right ventricular size is normal. There is normal pulmonary artery systolic pressure.  3. The mitral valve is normal in structure. Trivial mitral valve regurgitation. No evidence of mitral stenosis.  4. The aortic valve is normal in structure. Aortic valve regurgitation is trivial. No aortic stenosis is present.  5. The inferior vena cava is normal in size with greater than 50% respiratory variability, suggesting right atrial pressure of 3 mmHg. FINDINGS  Left Ventricle: Left ventricular ejection fraction, by estimation, is 60 to 65%. The left ventricle has normal function. The left ventricle has no regional wall motion abnormalities. The left ventricular internal cavity size  was normal in size. There is  mild concentric left ventricular hypertrophy. Left ventricular diastolic parameters are consistent with Grade I diastolic dysfunction (impaired relaxation). Normal left ventricular filling pressure. Right Ventricle: The right ventricular size is normal. No increase in right ventricular wall thickness. Right ventricular systolic function is normal. There is normal pulmonary artery systolic pressure. Left Atrium: Left atrial size was normal in size. Right Atrium: Right atrial size was normal in size. Pericardium: There is no evidence of pericardial effusion. Mitral Valve: The mitral valve is normal in structure. Trivial mitral valve regurgitation. No evidence of mitral valve stenosis. Tricuspid Valve: The tricuspid valve is normal in structure. Tricuspid valve regurgitation is trivial. No evidence of tricuspid stenosis. Aortic Valve: The aortic valve is normal in structure. Aortic valve regurgitation is trivial. No aortic stenosis is present. Pulmonic Valve: The pulmonic valve was normal in structure. Pulmonic valve regurgitation is not visualized. No evidence of pulmonic stenosis. Aorta: The aortic root is normal in size and structure. Venous: The inferior vena cava is normal in size with greater than 50% respiratory variability, suggesting right atrial pressure of 3 mmHg. IAS/Shunts: No atrial level shunt detected by color flow Doppler. LEFT VENTRICLE PLAX 2D LVIDd:         4.50 cm  Diastology LVIDs:         2.90 cm  LV e' medial:    7.62 cm/s LV PW:         1.10 cm  LV E/e' medial:  9.4 LV IVS:        1.00 cm  LV e' lateral:   8.92 cm/s LVOT diam:     2.10 cm  LV E/e' lateral: 8.1 LV SV:         72 LV SV Index:   33 LVOT Area:     3.46 cm  IVC IVC diam: 1.50 cm LEFT ATRIUM         Index LA diam:    3.90 cm 1.75 cm/m  AORTIC VALVE LVOT Vmax:   86.80 cm/s LVOT Vmean:  58.500 cm/s LVOT VTI:    0.209 m  AORTA Ao Asc diam: 3.10 cm MITRAL VALVE MV Area (PHT): 2.95 cm    SHUNTS MV Decel  Time: 257 msec  Systemic VTI:  0.21 m MV E velocity: 72.00 cm/s  Systemic Diam: 2.10 cm MV A velocity: 51.80 cm/s MV E/A ratio:  1.39 Ena Dawley MD Electronically signed by Ena Dawley MD Signature Date/Time: 12/18/2020/11:03:36 AM    Final    Disposition   Pt is being discharged home today in good condition.  Follow-up Plans & Appointments     Follow-up Information    Burtis Junes, NP Follow up on 01/15/2021.   Specialties: Nurse Practitioner, Interventional Cardiology, Cardiology, Radiology Why: at 11:15am for your follow up appt.  Contact information: [address] [place] [address] [phone]        Rudene Anda, MD Follow up.   Specialty: Internal Medicine Why: Regarding further management of your diabetes Contact information: [id number] [place] [address] [phone]              Discharge Instructions    Amb Referral to Cardiac Rehabilitation   Complete by: As directed    Will send Cardiac Rehab Phase 2 referral to [place]   Diagnosis:  STEMI Coronary Stents     After initial evaluation and assessments completed: Virtual Based Care may be provided alone or in conjunction with Phase 2 Cardiac Rehab based on patient barriers.: Yes   Call MD for:  difficulty breathing, headache or visual disturbances   Complete by: As directed    Call MD for:  persistant dizziness or light-headedness   Complete by: As directed    Call MD for:  redness, tenderness, or signs of infection (pain, swelling, redness, odor or green/yellow discharge around incision site)   Complete by: As directed    Diet - low sodium heart healthy   Complete by: As directed    Discharge instructions   Complete by: As directed    Radial Site Care Refer to this sheet in the next few weeks. These instructions provide you with information on caring for yourself after your procedure. Your caregiver may also give you more specific instructions. Your  treatment has been planned according to current medical practices, but problems sometimes occur. Call your caregiver if you have any problems or questions after your procedure. HOME CARE INSTRUCTIONS You may shower the day after the procedure.Remove the bandage (dressing) and gently wash the site with plain soap and water.Gently pat the site dry.  Do not apply powder or lotion to the site.  Do not submerge the affected site in water for 3 to 5 days.  Inspect the site at least twice daily.  Do not flex or bend the affected arm for 24 hours.  No lifting over 5 pounds (2.3 kg) for 5 days after your procedure.  Do not drive home if you are discharged the same day of the procedure. Have someone else drive you.  You may drive 24 hours after the procedure unless otherwise instructed by your caregiver.  What to expect: Any bruising will usually fade within 1 to 2 weeks.  Blood that collects in the tissue (hematoma) may be painful to the touch. It should usually decrease in size and tenderness within 1 to 2 weeks.  SEEK IMMEDIATE MEDICAL CARE IF: You have unusual pain at the radial site.  You have redness, warmth, swelling, or pain at the radial site.  You have drainage (other than a small amount of blood on the dressing).  You have chills.  You have a fever or persistent symptoms for more than 72 hours.  You  have a fever and your symptoms suddenly get worse.  Your arm becomes pale, cool, tingly, or numb.  You have heavy bleeding from the site. Hold pressure on the site.   PLEASE DO NOT MISS ANY DOSES OF YOUR BRILINTA!!!!! Also keep a log of you blood pressures and bring back to your follow up appt. Please call the office with any questions.   Patients taking blood thinners should generally stay away from medicines like ibuprofen, Advil, Motrin, naproxen, and Aleve due to risk of stomach bleeding. You may take Tylenol as directed or talk to your primary doctor about alternatives.  PLEASE ENSURE  THAT YOU DO NOT RUN OUT OF YOUR BRILINTA/PLAVIX. This medication is very important to remain on for at least one year. IF you have issues obtaining this medication due to cost please CALL the office 3-5 business days prior to running out in order to prevent missing doses of this medication.   Increase activity slowly   Complete by: As directed       Discharge Medications   Allergies as of 12/19/2020      Reactions   Ace Inhibitors Swelling   .    Codeine Nausea And Vomiting   Latex Itching, Rash   Tape Itching, Rash      Medication List    STOP taking these medications   amLODipine 10 MG tablet Commonly known as: NORVASC   cilostazol 50 MG tablet Commonly known as: PLETAL   hydrALAZINE 25 MG tablet Commonly known as: APRESOLINE     TAKE these medications   aspirin EC 81 MG tablet Take 81 mg by mouth daily.   atorvastatin 80 MG tablet Commonly known as: LIPITOR Take 1 tablet (80 mg total) by mouth daily. Start taking on: December 20, 2020 What changed:   medication strength  how much to take   losartan 25 MG tablet Commonly known as: COZAAR Take 25 mg by mouth daily.   metoprolol tartrate 25 MG tablet Commonly known as: LOPRESSOR Take 1 tablet (25 mg total) by mouth 2 (two) times daily. What changed: when to take this   nitroGLYCERIN 0.4 MG SL tablet Commonly known as: Nitrostat Place 1 tablet (0.4 mg total) under the tongue every 5 (five) minutes as needed.   ticagrelor 90 MG Tabs tablet Commonly known as: BRILINTA Take 1 tablet (90 mg total) by mouth 2 (two) times daily.          Outstanding Labs/Studies   FLP/LFTs in 8 weeks   Duration of Discharge Encounter   Greater than 30 minutes including physician time.  Signed, Reino Bellis, NP 12/19/2020, 12:02 PM

## 2020-12-19 NOTE — Progress Notes (Signed)
Progress Note  Patient Name: Katie Pearson Date of Encounter: 12/19/2020  [facility] Cardiologist: No primary care provider on file.   Subjective   No chest pain or dyspnea. No events overnight.   Inpatient Medications    Scheduled Meds: . aspirin EC  81 mg Oral Daily  . atorvastatin  80 mg Oral Daily  . Chlorhexidine Gluconate Cloth  6 each Topical Daily  . COVID-19 mRNA Vac-TriS (Pfizer)  0.3 mL Intramuscular Once  . enoxaparin (LOVENOX) injection  40 mg Subcutaneous Q24H  . metoprolol tartrate  25 mg Oral BID  . sodium chloride flush  3 mL Intravenous Q12H  . ticagrelor  90 mg Oral Q12H   Continuous Infusions: . sodium chloride     PRN Meds: sodium chloride, acetaminophen, ondansetron (ZOFRAN) IV, sodium chloride flush   Vital Signs    Vitals:   12/18/20 1944 12/18/20 2236 12/19/20 0509 12/19/20 0946  BP: 123/83 131/75 133/79 119/75  Pulse: 64 72 69 68  Resp: 18  20   Temp: 98.1 F (36.7 C)  98.1 F (36.7 C)   TempSrc: Oral  Oral   SpO2: 100%  99% 97%  Weight:      Height:        Intake/Output Summary (Last 24 hours) at 12/19/2020 1030 Last data filed at 12/19/2020 0947 Gross per 24 hour  Intake 363 ml  Output 2 ml  Net 361 ml   Last 3 Weights 12/18/2020 12/17/2020 03/27/2020  Weight (lbs) 263 lb 10.7 oz 260 lb 260 lb  Weight (kg) 119.6 kg 117.935 kg 117.935 kg      Telemetry    Sinus  - Personally Reviewed  ECG      No AM EKG- Personally Reviewed  Physical Exam   General: Well developed, well nourished, NAD  HEENT: OP clear, mucus membranes moist  SKIN: warm, dry. No rashes. Neuro: No focal deficits  Musculoskeletal: Muscle strength 5/5 all ext  Psychiatric: Mood and affect normal  Neck: No JVD, no carotid bruits, no thyromegaly, no lymphadenopathy.  Lungs:Clear bilaterally, no wheezes, rhonci, crackles Cardiovascular: Regular rate and rhythm. No murmurs, gallops or rubs. Abdomen:Soft. Bowel sounds present. Non-tender.  Extremities: No  lower extremity edema. Pulses are 2 + in the bilateral DP/PT.   Labs    High Sensitivity Troponin:   Recent Labs  Lab 12/17/20 1558 12/17/20 1737 12/17/20 2225 12/18/20 0651 12/18/20 1242  TROPONINIHS 99* 259* >27,000* >27,000* 16,804*      Chemistry Recent Labs  Lab 12/17/20 1558 12/18/20 0651 12/19/20 0705  NA 137 138 136  K 4.4 3.1* 3.3*  CL 105 105 103  CO2 17* 22 22  GLUCOSE 190* 119* 98  BUN 13 8 12   CREATININE 0.75 0.69 0.70  CALCIUM 8.8* 8.9 8.9  GFRNONAA >60 >60 >60  ANIONGAP 15 11 11      Hematology Recent Labs  Lab 12/17/20 1558 12/19/20 0705  WBC 9.6 6.7  RBC 3.70* 3.55*  HGB 11.5* 10.5*  HCT 33.7* 32.8*  MCV 91.1 92.4  MCH 31.1 29.6  MCHC 34.1 32.0  RDW 15.4 15.3  PLT 183 336    BNPNo results for input(s): BNP, PROBNP in the last 168 hours.   DDimer No results for input(s): DDIMER in the last 168 hours.   Radiology    DG Chest 2 View  Result Date: 12/17/2020 CLINICAL DATA:  Chest pain. EXAM: CHEST - 2 VIEW COMPARISON:  December 11, 2020 FINDINGS: The heart size and mediastinal contours are within  normal limits. The lung volumes are low. Mild increased pulmonary interstitium is identified bilaterally. There is no pleural effusion. Mild patchy opacity of left lung base is noted. The visualized skeletal structures are unremarkable. IMPRESSION: 1. Mild increased pulmonary interstitium bilaterally. This can be seen in viral infection or mild interstitial edema. 2. Mild patchy opacity of left lung base, developing pneumonia is not excluded. Electronically Signed   By: Abelardo Diesel M.D.   On: 12/17/2020 16:28   CARDIAC CATHETERIZATION  Result Date: 12/17/2020 Conclusions: 1. Multivessel coronary artery disease, including 70% mid LAD in-stent restenosis, 60% mid LCx disease, 80% proximal/mid RCA stenosis, and thrombosis of recently placed distal RCA stent involving rPDA and rPLA. 2. Moderately elevated left ventricular filling pressure (LVEDP 25-30  mmHg). 3. Successful IVUS-guided angioplasty to distal RCA, rPDA, and RPLa with reestablishment of TIMI-3 flow and improved expansion of distal RCA stent. 4. Successful PCI to proximal/mid RCA using Resolute Onyx 3.5 x 22 mm drug-eluting stent with 0% residual stenosis and TIMI-3 flow.  Recommendations: 1. Continue tirofiban infusion for 6 hours. 2. Dual antiplatelet therapy with aspirin and ticagrelor for at least 12 months, ideally longer 3. Aggressive secondary prevention. 4. Favor medical therapy of LAD and LCx disease unless the patient has refractory symptoms. Nelva Bush, MD [facility]  ECHOCARDIOGRAM LIMITED  Result Date: 12/18/2020    ECHOCARDIOGRAM LIMITED REPORT   Patient Name:   [facility] Hiemstra Date of Exam: 12/18/2020 Medical Rec #:  [id number]    Height:       65.0 in Accession #:    [id number]   Weight:       263.7 lb Date of Birth:  06/07/1981   BSA:          2.225 m Patient Age:    40 years     BP:           119/71 mmHg Patient Gender: F            HR:           64 bpm. Exam Location:  Inpatient Procedure: Limited Echo, Limited Color Doppler and Cardiac Doppler Indications:    acute myocardial infarction  History:        Patient has no prior history of Echocardiogram examinations.                 CAD; Covid.  Sonographer:    Johny Chess Referring Phys: [phone] Stepfanie Yott END  Sonographer Comments: Image acquisition challenging due to patient body habitus. IMPRESSIONS  1. Left ventricular ejection fraction, by estimation, is 60 to 65%. The left ventricle has normal function. The left ventricle has no regional wall motion abnormalities. There is mild concentric left ventricular hypertrophy. Left ventricular diastolic parameters are consistent with Grade I diastolic dysfunction (impaired relaxation).  2. Right ventricular systolic function is normal. The right ventricular size is normal. There is normal pulmonary artery systolic pressure.  3. The mitral valve is normal in structure.  Trivial mitral valve regurgitation. No evidence of mitral stenosis.  4. The aortic valve is normal in structure. Aortic valve regurgitation is trivial. No aortic stenosis is present.  5. The inferior vena cava is normal in size with greater than 50% respiratory variability, suggesting right atrial pressure of 3 mmHg. FINDINGS  Left Ventricle: Left ventricular ejection fraction, by estimation, is 60 to 65%. The left ventricle has normal function. The left ventricle has no regional wall motion abnormalities. The left ventricular internal cavity size was normal in size. There is  mild concentric left ventricular hypertrophy. Left ventricular diastolic parameters are consistent with Grade I diastolic dysfunction (impaired relaxation). Normal left ventricular filling pressure. Right Ventricle: The right ventricular size is normal. No increase in right ventricular wall thickness. Right ventricular systolic function is normal. There is normal pulmonary artery systolic pressure. Left Atrium: Left atrial size was normal in size. Right Atrium: Right atrial size was normal in size. Pericardium: There is no evidence of pericardial effusion. Mitral Valve: The mitral valve is normal in structure. Trivial mitral valve regurgitation. No evidence of mitral valve stenosis. Tricuspid Valve: The tricuspid valve is normal in structure. Tricuspid valve regurgitation is trivial. No evidence of tricuspid stenosis. Aortic Valve: The aortic valve is normal in structure. Aortic valve regurgitation is trivial. No aortic stenosis is present. Pulmonic Valve: The pulmonic valve was normal in structure. Pulmonic valve regurgitation is not visualized. No evidence of pulmonic stenosis. Aorta: The aortic root is normal in size and structure. Venous: The inferior vena cava is normal in size with greater than 50% respiratory variability, suggesting right atrial pressure of 3 mmHg. IAS/Shunts: No atrial level shunt detected by color flow Doppler. LEFT  VENTRICLE PLAX 2D LVIDd:         4.50 cm  Diastology LVIDs:         2.90 cm  LV e' medial:    7.62 cm/s LV PW:         1.10 cm  LV E/e' medial:  9.4 LV IVS:        1.00 cm  LV e' lateral:   8.92 cm/s LVOT diam:     2.10 cm  LV E/e' lateral: 8.1 LV SV:         72 LV SV Index:   33 LVOT Area:     3.46 cm  IVC IVC diam: 1.50 cm LEFT ATRIUM         Index LA diam:    3.90 cm 1.75 cm/m  AORTIC VALVE LVOT Vmax:   86.80 cm/s LVOT Vmean:  58.500 cm/s LVOT VTI:    0.209 m  AORTA Ao Asc diam: 3.10 cm MITRAL VALVE MV Area (PHT): 2.95 cm    SHUNTS MV Decel Time: 257 msec    Systemic VTI:  0.21 m MV E velocity: 72.00 cm/s  Systemic Diam: 2.10 cm MV A velocity: 51.80 cm/s MV E/A ratio:  1.39 Ena Dawley MD Electronically signed by Ena Dawley MD Signature Date/Time: 12/18/2020/11:03:36 AM    Final     Cardiac Studies     Patient Profile     40 y.o. female with history of CAD, DM, HTN, Covid positive, prior tobacco abuse, prior DVT admitted with inferior STEMI secondary to thrombotic occlusion of a recently placed distal RCA stent due to underexpansion treated with balloon angioplasty and also with treatment of a severe proximal RCA stenosis with a drug eluting stent.   Assessment & Plan    1. CAD/Inferior STEMI: No chest pain. Admitted with inferior STEMI.  S/p balloon angioplasty distal RCA stent which was occluded and placement of proximal RCA stent. Will continue ASA, Brilinta, statin and beta blocker. LVEF=60-65% by echo yesterday. Will plan medical therapy for LAD and Circumflex disease.   2. Recent Covid infection: Isolation precautions in place. Positive test 12/11/20.   3. DM: SSI. Recent A1C 6.3 on 12/11/20.   4. HTN: BP is well controlled. Continue current therapy  5. Hypokalemia: Will replace potassium today.   Will discharge home today. She can follow up with  me or with the cardiology group in [place], [place].   For questions or updates, please contact [place] Please consult  [email or web address] for contact info under        Signed, Lauree Chandler, MD  12/19/2020, 10:30 AM

## 2020-12-22 ENCOUNTER — Telehealth (HOSPITAL_COMMUNITY): Payer: Self-pay

## 2020-12-22 NOTE — Telephone Encounter (Signed)
Per phase I, fax cardiac rehab referral to [facility] cardiac rehab.

## 2020-12-26 ENCOUNTER — Telehealth: Payer: Self-pay | Admitting: Cardiovascular Disease

## 2021-01-01 NOTE — Progress Notes (Deleted)
CARDIOLOGY OFFICE NOTE  Date:  01/01/2021    Corrie Pearson Date of Birth: [birth year] Medical Record #[id number]  PCP:  Rudene Anda, MD  Cardiologist:  Angelena Form   No chief complaint on file.   History of Present Illness: Katie Pearson is a 40 y.o. female who presents today for a post hospital visit. Seen for Dr. Angelena Form (NEW).   She has a history of known CAD with multiple prior interventions, including PCI of distal RCA bifurcation 2 days ago for NSTEMI (in-stent). At that time she was also positive for SARS-CoV2, asymptomatic and not requiring treatment. She also has a history of HTN, type 2 DM, HLD,smoking (quit 2019), DVT in 2017 (off OAC),and obesity.   [name] reportedly had a total of 5 stents in July and October 2016 for[place]in [place] (LAD, RCA, RPL, PDA), then NSTEMI in 2019 withthrombotic LAD occlusion just proximal to the prior mid-LAD stent. EF remained preserved. She was just recently admitted a few days ago on 1/17 with NSTEMI after presenting with worsening chest pain. She was found to have occlusion of the distal RCA bifurcation at the previously placed stents treated with 2.75x18 DES in distal RCA to ostial PDA with PCTA of the PL branch. EF was 50-55%. She was restarted on Brilinta. Of note, she also tested positive for SARS-CoV2 and was unvaccinated. She was not hypoxic and did not meet criteria for any specific treatment.   Upon review of her medicines - it appeared that she had not been taking her Brilinta.   Looks to have already followed up with [facility] cardiology in [place] changed to [place].   Comes in today. Here with   Past Medical History:  Diagnosis Date  . Back pain   . Hypertension   . MI (myocardial infarction) ([place])   . Obesity   . Pulmonary emboli [facility])     Past Surgical History:  Procedure Laterality Date  . CESAREAN SECTION    . CORONARY ANGIOPLASTY WITH STENT PLACEMENT  2016, 2019, 2022  . CORONARY/GRAFT ACUTE MI  REVASCULARIZATION N/A 12/17/2020   Procedure: Coronary/Graft Acute MI Revascularization;  Surgeon: Nelva Bush, MD;  Location: [place] CV [facility];  Service: Cardiovascular;  Laterality: N/A;  . LAMINECTOMY    . LEFT HEART CATH AND CORONARY ANGIOGRAPHY N/A 12/17/2020   Procedure: LEFT HEART CATH AND CORONARY ANGIOGRAPHY;  Surgeon: Nelva Bush, MD;  Location: [place] [facility];  Service: Cardiovascular;  Laterality: N/A;  . TUBAL LIGATION       Medications: No outpatient medications have been marked as taking for the 01/15/21 encounter (Appointment) with Burtis Junes, NP.     Allergies: Allergies  Allergen Reactions  . Ace Inhibitors Swelling    .   Marland Kitchen Codeine Nausea And Vomiting  . Latex Itching and Rash  . Tape Itching and Rash    Social History: The patient  reports that she quit smoking about 5 years ago. She smoked 0.25 packs per day. She has never used smokeless tobacco. She reports current alcohol use. She reports that she does not use drugs.   Family History: The patient's ***family history is not on file.   Review of Systems: Please see the history of present illness.   All other systems are reviewed and negative.   Physical Exam: VS:  There were no vitals taken for this visit. Marland Kitchen  BMI There is no height or weight on file to calculate BMI.  Wt Readings from Last 3 Encounters:  12/18/20 263 lb 10.7 oz (119.6 kg)  03/27/20 260 lb (117.9 kg)  10/05/19 260 lb (117.9 kg)    General: Pleasant. Well developed, well nourished and in no acute distress.   HEENT: Normal.  Neck: Supple, no JVD, carotid bruits, or masses noted.  Cardiac: ***Regular rate and rhythm. No murmurs, rubs, or gallops. No edema.  Respiratory:  Lungs are clear to auscultation bilaterally with normal work of breathing.  GI: Soft and nontender.  MS: No deformity or atrophy. Gait and ROM intact.  Skin: Warm and dry. Color is normal.  Neuro:  Strength and sensation are intact and no gross  focal deficits noted.  Psych: Alert, appropriate and with normal affect.   LABORATORY DATA:  EKG:  EKG {ACTION; IS/IS [id number]} ordered today.  Personally reviewed by me. This demonstrates ***.  Lab Results  Component Value Date   WBC 6.7 12/19/2020   HGB 10.5 (L) 12/19/2020   HCT 32.8 (L) 12/19/2020   PLT 336 12/19/2020   GLUCOSE 98 12/19/2020   ALT 23 05/19/2019   AST 16 05/19/2019   NA 136 12/19/2020   K 3.3 (L) 12/19/2020   CL 103 12/19/2020   CREATININE 0.70 12/19/2020   BUN 12 12/19/2020   CO2 22 12/19/2020     BNP (last 3 results) No results for input(s): BNP in the last 8760 hours.  ProBNP (last 3 results) No results for input(s): PROBNP in the last 8760 hours.   Other Studies Reviewed Today:  Cath: 12/17/20  Conclusions: 1. Multivessel coronary artery disease, including 70% mid LAD in-stent restenosis, 60% mid LCx disease, 80% proximal/mid RCA stenosis, and thrombosis of recently placed distal RCA stent involving rPDA and rPLA. 2. Moderately elevated left ventricular filling pressure (LVEDP 25-30 mmHg). 3. Successful IVUS-guided angioplasty to distal RCA, rPDA, and RPLa with reestablishment of TIMI-3 flow and improved expansion of distal RCA stent. 4. Successful PCI to proximal/mid RCA using Resolute Onyx 3.5 x 22 mm drug-eluting stent with 0% residual stenosis and TIMI-3 flow.  Recommendations: 1. Continue tirofiban infusion for 6 hours. 2. Dual antiplatelet therapy with aspirin and ticagrelor for at least 12 months, ideally longer 3. Aggressive secondary prevention. 4. Favor medical therapy of LAD and LCx disease unless the patient has refractory symptoms.  Nelva Bush, MD [facility]   Diagnostic Dominance: Right    Intervention     Echo: 12/18/20  IMPRESSIONS    1. Left ventricular ejection fraction, by estimation, is 60 to 65%. The  left ventricle has normal function. The left ventricle has no regional  wall motion  abnormalities. There is mild concentric left ventricular  hypertrophy. Left ventricular diastolic  parameters are consistent with Grade I diastolic dysfunction (impaired  relaxation).  2. Right ventricular systolic function is normal. The right ventricular  size is normal. There is normal pulmonary artery systolic pressure.  3. The mitral valve is normal in structure. Trivial mitral valve  regurgitation. No evidence of mitral stenosis.  4. The aortic valve is normal in structure. Aortic valve regurgitation is  trivial. No aortic stenosis is present.  5. The inferior vena cava is normal in size with greater than 50%  respiratory variability, suggesting right atrial pressure of 3 mmHg.  _____________    Assessment/Plan: 1. CAD/Inferior STEMI: Admitted with inferior STEMI.S/p balloon angioplasty distal RCA stent which was occluded and placement of proximal RCA stent. Does not appear that she was taking her Brilinta prior to admission. No further chest pain post cath.  -- continue on  ASA, Brilinta, statin and beta blocker. LVEF=60-65% by echo yesterday. Will plan medicaltherapy for LAD and Circumflex disease.   2. Recent Covid infection: Isolation precautions in place. Positive test 12/11/20. She is to continue on quarantine until 1/28.   3. DM: Recent HgbA1c 6.3 -- instructed to follow up with PCP as an outpatient for further management  4. HTN:BP is well controlled on metoprolol 25mg  BID during admission. Will resume losartan at discharge.  -- instructed to follow her BP at discharge. If needed can add back prior home meds.  5. Hypokalemia: Supplemented prior to discharge.   6. HLD: home atorvastatin was increased from 10mg  daily to 80mg  daily -- FLP/LFTs in 8 weeks     Current medicines are reviewed with the patient today.  The patient does not have concerns regarding medicines other than what has been noted above.  The following changes have been made:  See  above.  Labs/ tests ordered today include:   No orders of the defined types were placed in this encounter.    Disposition:   FU with *** in {gen number [id number] {Days to years:10300}.   Patient is agreeable to this plan and will call if any problems develop in the interim.   SignedTruitt Merle, NP  01/01/2021 7:42 AM  [facility] [address] [place], [place]  [address] Phone: [phone] Fax: [phone]

## 2021-01-10 NOTE — Telephone Encounter (Signed)
No reply from patient regarding her [place] paperwork from [place].

## 2021-01-15 ENCOUNTER — Ambulatory Visit: Payer: Medicaid Other | Admitting: Nurse Practitioner

## 2021-01-24 ENCOUNTER — Telehealth: Payer: Medicaid Other | Admitting: Physician Assistant

## 2021-01-24 ENCOUNTER — Other Ambulatory Visit: Payer: Self-pay

## 2021-08-13 ENCOUNTER — Encounter (HOSPITAL_BASED_OUTPATIENT_CLINIC_OR_DEPARTMENT_OTHER): Payer: Self-pay

## 2021-08-13 ENCOUNTER — Other Ambulatory Visit: Payer: Self-pay

## 2021-08-13 ENCOUNTER — Emergency Department (HOSPITAL_BASED_OUTPATIENT_CLINIC_OR_DEPARTMENT_OTHER)
Admission: EM | Admit: 2021-08-13 | Discharge: 2021-08-13 | Disposition: A | Discharge status: Home / Self Care | Payer: Medicaid Other | Attending: Emergency Medicine | Admitting: Emergency Medicine

## 2021-08-13 DIAGNOSIS — L0291 Cutaneous abscess, unspecified: Secondary | ICD-10-CM

## 2021-08-13 DIAGNOSIS — Z91040 Latex allergy status: Secondary | ICD-10-CM | POA: Insufficient documentation

## 2021-08-13 DIAGNOSIS — I10 Essential (primary) hypertension: Secondary | ICD-10-CM | POA: Insufficient documentation

## 2021-08-13 DIAGNOSIS — R112 Nausea with vomiting, unspecified: Secondary | ICD-10-CM | POA: Diagnosis not present

## 2021-08-13 DIAGNOSIS — Z20822 Contact with and (suspected) exposure to covid-19: Secondary | ICD-10-CM | POA: Insufficient documentation

## 2021-08-13 DIAGNOSIS — Z79899 Other long term (current) drug therapy: Secondary | ICD-10-CM | POA: Diagnosis not present

## 2021-08-13 DIAGNOSIS — Z951 Presence of aortocoronary bypass graft: Secondary | ICD-10-CM | POA: Diagnosis not present

## 2021-08-13 DIAGNOSIS — R197 Diarrhea, unspecified: Secondary | ICD-10-CM | POA: Diagnosis not present

## 2021-08-13 DIAGNOSIS — Z87891 Personal history of nicotine dependence: Secondary | ICD-10-CM | POA: Diagnosis not present

## 2021-08-13 DIAGNOSIS — E1169 Type 2 diabetes mellitus with other specified complication: Secondary | ICD-10-CM | POA: Insufficient documentation

## 2021-08-13 DIAGNOSIS — Z7982 Long term (current) use of aspirin: Secondary | ICD-10-CM | POA: Diagnosis not present

## 2021-08-13 DIAGNOSIS — L02415 Cutaneous abscess of right lower limb: Secondary | ICD-10-CM | POA: Insufficient documentation

## 2021-08-13 DIAGNOSIS — E785 Hyperlipidemia, unspecified: Secondary | ICD-10-CM | POA: Diagnosis not present

## 2021-08-13 LAB — URINALYSIS, MICROSCOPIC (REFLEX)

## 2021-08-13 LAB — COMPREHENSIVE METABOLIC PANEL
ALT: 29 U/L (ref 0–44)
AST: 21 U/L (ref 15–41)
Albumin: 4.1 g/dL (ref 3.5–5.0)
Alkaline Phosphatase: 65 U/L (ref 38–126)
Anion gap: 9 (ref 5–15)
BUN: 14 mg/dL (ref 6–20)
CO2: 25 mmol/L (ref 22–32)
Calcium: 9.2 mg/dL (ref 8.9–10.3)
Chloride: 105 mmol/L (ref 98–111)
Creatinine, Ser: 0.65 mg/dL (ref 0.44–1.00)
GFR, Estimated: >60 mL/min (ref >60)
Glucose, Bld: 113 mg/dL — ABNORMAL HIGH (ref 70–99)
Potassium: 3.6 mmol/L (ref 3.5–5.1)
Sodium: 139 mmol/L (ref 135–145)
Total Bilirubin: 0.3 mg/dL (ref 0.3–1.2)
Total Protein: 8.9 g/dL — ABNORMAL HIGH (ref 6.5–8.1)

## 2021-08-13 LAB — RESP PANEL BY RT-PCR (FLU A&B, COVID) ARPGX2
Influenza A by PCR: NEGATIVE
Influenza B by PCR: NEGATIVE
SARS Coronavirus 2 by RT PCR: NEGATIVE

## 2021-08-13 LAB — CBC
HCT: 36.1 % (ref 36.0–46.0)
Hemoglobin: 11.6 g/dL — ABNORMAL LOW (ref 12.0–15.0)
MCH: 29.7 pg (ref 26.0–34.0)
MCHC: 32.1 g/dL (ref 30.0–36.0)
MCV: 92.6 fL (ref 80.0–100.0)
Platelets: 358 10*3/uL (ref 150–400)
RBC: 3.9 MIL/uL (ref 3.87–5.11)
RDW: 16 % — ABNORMAL HIGH (ref 11.5–15.5)
WBC: 9.1 10*3/uL (ref 4.0–10.5)
nRBC: 0 % (ref 0.0–0.2)

## 2021-08-13 LAB — URINALYSIS, ROUTINE W REFLEX MICROSCOPIC
Glucose, UA: NEGATIVE mg/dL
Ketones, ur: NEGATIVE mg/dL
Nitrite: NEGATIVE
Protein, ur: NEGATIVE mg/dL
Specific Gravity, Urine: >1.03 — ABNORMAL HIGH (ref 1.005–1.030)
pH: 6 (ref 5.0–8.0)

## 2021-08-13 LAB — PREGNANCY, URINE: Preg Test, Ur: NEGATIVE

## 2021-08-13 LAB — LIPASE, BLOOD: Lipase: 31 U/L (ref 11–51)

## 2021-08-13 LAB — GROUP A STREP BY PCR: Group A Strep by PCR: NOT DETECTED

## 2021-08-13 MED ORDER — DOXYCYCLINE HYCLATE 100 MG PO TABS
100.0000 mg | ORAL_TABLET | Freq: Once | ORAL | Status: AC
  Administered 2021-08-13: 100 mg via ORAL
  Filled 2021-08-13: qty 1

## 2021-08-13 MED ORDER — DOXYCYCLINE HYCLATE 100 MG PO CAPS: 100.0000 mg | ORAL_CAPSULE | Freq: Two times a day (BID) | ORAL | 0 refills | Status: AC

## 2021-08-13 MED ORDER — LIDOCAINE-EPINEPHRINE (PF) 2 %-1:200000 IJ SOLN
20.0000 mL | Freq: Once | INTRAMUSCULAR | Status: AC
  Administered 2021-08-13: 20 mL
  Filled 2021-08-13: qty 20

## 2021-08-13 NOTE — Discharge Instructions (Addendum)
You were seen in the emergency department today for nausea vomiting and diarrhea, as well as a bite on the back your leg of unknown origin.  While you are here we opened up the wound which was only able to get a small amount of pus out of.  We used ultrasound which showed no other areas to explore around the right.  Your work-up has been reassuring however I do not have an exact cause of your symptoms.  We are still awaiting your COVID test and testing for tickborne diseases.  In the meantime I am prescribing you an antibiotic called doxycycline.  You will take this medication twice a day for the next 7 days.  Please follow-up with your primary care provider tomorrow to schedule an appointment with him in the next 48 hours.  I have already discussed with you reasons to return to the emergency department including if your symptoms are worsening, shortness of breath, chest pain, increasing redness or pain of the wound.  We did not close the wound or pack the wound as it is too small.  I have instructed you to keep it covered with the 4 x 4 and wrapping.  Do not soak it.  Please return to emergency department for any reason he.  Thank you for trusting [place] with your care

## 2021-08-13 NOTE — ED Triage Notes (Signed)
N/v/d x 1 week. States she got bit by something on her on upper posterior right thigh that she ' popped" last night. ~1 inch oozing papule noted to area. Subjective fever

## 2021-08-13 NOTE — ED Notes (Signed)
Pt states she does not want to be stuck for labs and would rather wait until she got back into a room so she would only get stuck once.

## 2021-08-13 NOTE — ED Provider Notes (Signed)
[facility] EMERGENCY DEPARTMENT Provider Note   CSN: [id number] Arrival date & time: 08/13/21  1600     History Chief Complaint  Patient presents with   Abdominal Pain    Katie Pearson is a 40 y.o. female with past medical history of MI hypertension, hyperlipidemia, type 2 diabetes who presents emergency department with abdominal pain.  She states that 9 days ago she was bit by what she thought was a mosquito; however, she did not see the bug.  States that for a week after bites she had dizziness, nausea/vomiting/diarrhea, fever and chills.  States that the bite area on the back of the right leg has become red, tender.  Yesterday she states that it popped by itself overnight.  She states she then woke up this morning feeling "sick," with sore throat, congestion and cough.  Currently denying chest pain, shortness of breath, palpitations.  She is tolerating p.o. intake at home.  Denies swelling of any of her joints, diffuse rash.  She denies sick contacts.  She states that she did travel to [place], however this was after the bug bite.  Denies hiking or other outdoor activities.  She does have a dog but is not seeing a tick on him.   Abdominal Pain Associated symptoms: cough, diarrhea, fever, nausea, sore throat and vomiting   Associated symptoms: no dysuria and no vaginal discharge       Past Medical History:  Diagnosis Date   Back pain    Hypertension    MI (myocardial infarction) ([place])    Obesity    Pulmonary emboli [facility])     Patient Active Problem List   Diagnosis Date Noted   Hyperlipidemia 12/19/2020   Hypertension 12/19/2020   Type 2 diabetes mellitus with complication, without long-term current use of insulin ([place]) 12/19/2020   STEMI (ST elevation myocardial infarction) ([place]) 12/17/2020   STEMI involving right coronary artery ([place]) 12/17/2020    Past Surgical History:  Procedure Laterality Date   CESAREAN SECTION     CORONARY ANGIOPLASTY WITH STENT  PLACEMENT  2016, 2019, 2022   CORONARY/GRAFT ACUTE MI REVASCULARIZATION N/A 12/17/2020   Procedure: Coronary/Graft Acute MI Revascularization;  Surgeon: Nelva Bush, MD;  Location: [facility];  Service: Cardiovascular;  Laterality: N/A;   LAMINECTOMY     LEFT HEART CATH AND CORONARY ANGIOGRAPHY N/A 12/17/2020   Procedure: LEFT HEART CATH AND CORONARY ANGIOGRAPHY;  Surgeon: Nelva Bush, MD;  Location: [facility];  Service: Cardiovascular;  Laterality: N/A;   TUBAL LIGATION       OB History   No obstetric history on file.     History reviewed. No pertinent family history.  Social History   Tobacco Use   Smoking status: Former    Packs/day: 0.25    Types: Cigarettes    Quit date: 10/30/2015    Years since quitting: 5.7   Smokeless tobacco: Never  Vaping Use   Vaping Use: Never used  Substance Use Topics   Alcohol use: Yes    Comment: occ   Drug use: No    Home Medications Prior to Admission medications   Medication Sig Start Date End Date Taking? Authorizing Provider  aspirin EC 81 MG tablet Take 81 mg by mouth daily.    [provider]  atorvastatin (LIPITOR) 80 MG tablet Take 1 tablet (80 mg total) by mouth daily. 12/20/20   Cheryln Manly, NP  losartan (COZAAR) 25 MG tablet Take 25 mg by mouth daily. 04/13/19  12/17/20  [provider]  metoprolol tartrate (LOPRESSOR) 25 MG tablet Take 1 tablet (25 mg total) by mouth 2 (two) times daily. 12/19/20   Cheryln Manly, NP  nitroGLYCERIN (NITROSTAT) 0.4 MG SL tablet Place 1 tablet (0.4 mg total) under the tongue every 5 (five) minutes as needed. 12/19/20   Cheryln Manly, NP  ticagrelor (BRILINTA) 90 MG TABS tablet Take 1 tablet (90 mg total) by mouth 2 (two) times daily. 12/19/20   Cheryln Manly, NP    Allergies    Ace inhibitors, Codeine, Latex, and Tape  Review of Systems   Review of Systems  Constitutional:  Positive for fever.  HENT:  Positive for sore throat.    Respiratory:  Positive for cough.   Gastrointestinal:  Positive for abdominal pain, diarrhea, nausea and vomiting.  Genitourinary:  Negative for dysuria, pelvic pain and vaginal discharge.  Skin:  Positive for wound.  Neurological:  Positive for dizziness.  All other systems reviewed and are negative.  Physical Exam Updated Vital Signs BP 121/81 (BP Location: Right Arm)   Pulse 64   Temp 98.3 F (36.8 C) (Oral)   Resp 16   Ht 5\' 6"  (1.676 m)   Wt 120.2 kg   SpO2 99%   BMI 42.77 kg/m   Physical Exam Vitals and nursing note reviewed.  Constitutional:      General: She is not in acute distress.    Appearance: Normal appearance. She is well-developed. She is not toxic-appearing.  HENT:     Head: Normocephalic and atraumatic.     Mouth/Throat:     Mouth: Mucous membranes are moist.     Pharynx: No pharyngeal swelling or oropharyngeal exudate.  Eyes:     General: No scleral icterus.    Extraocular Movements: Extraocular movements intact.  Cardiovascular:     Rate and Rhythm: Normal rate and regular rhythm.     Heart sounds: Normal heart sounds. No murmur heard. Pulmonary:     Effort: Pulmonary effort is normal. No respiratory distress.     Breath sounds: Normal breath sounds. No rhonchi or rales.  Abdominal:     General: Abdomen is protuberant. Bowel sounds are normal. There is no distension.     Palpations: Abdomen is soft. There is no hepatomegaly.     Tenderness: There is no abdominal tenderness.  Skin:    General: Skin is warm and dry.     Capillary Refill: Capillary refill takes less than 2 seconds.     Coloration: Skin is not jaundiced.     Findings: Wound present. No rash.       Neurological:     General: No focal deficit present.     Mental Status: She is alert and oriented to person, place, and time.  Psychiatric:        Mood and Affect: Mood normal.        Behavior: Behavior normal.    ED Results / Procedures / Treatments   Labs (all labs ordered are  listed, but only abnormal results are displayed) Labs Reviewed  COMPREHENSIVE METABOLIC PANEL - Abnormal; Notable for the following components:      Result Value   Glucose, Bld 113 (*)    Total Protein 8.9 (*)    All other components within normal limits  CBC - Abnormal; Notable for the following components:   Hemoglobin 11.6 (*)    RDW 16.0 (*)    All other components within normal limits  URINALYSIS, ROUTINE W REFLEX  MICROSCOPIC - Abnormal; Notable for the following components:   APPearance HAZY (*)    Specific Gravity, Urine >1.030 (*)    Hgb urine dipstick LARGE (*)    Bilirubin Urine SMALL (*)    Leukocytes,Ua SMALL (*)    All other components within normal limits  URINALYSIS, MICROSCOPIC (REFLEX) - Abnormal; Notable for the following components:   Bacteria, UA MANY (*)    All other components within normal limits  GROUP A STREP BY PCR  RESP PANEL BY RT-PCR (FLU A&B, COVID) ARPGX2  LIPASE, BLOOD  PREGNANCY, URINE  ROCKY MTN SPOTTED FVR ABS PNL(IGG+IGM)  LYME DISEASE DNA BY PCR(BORRELIA BURG)    EKG None  Radiology No results found.  Procedures .Marland KitchenIncision and Drainage  Date/Time: 08/13/2021 9:01 PM Performed by: Mickie Hillier, PA-C Authorized by: Mickie Hillier, PA-C   Consent:    Consent obtained:  Verbal   Consent given by:  Patient   Risks, benefits, and alternatives were discussed: yes     Risks discussed:  Bleeding, incomplete drainage, pain and infection   Alternatives discussed:  No treatment Universal protocol:    Procedure explained and questions answered to patient or proxy's satisfaction: yes     Patient identity confirmed:  Verbally with patient Location:    Type:  Abscess   Size:  1.5cm   Location:  Lower extremity   Lower extremity location:  Leg   Leg location:  R upper leg Pre-procedure details:    Skin preparation:  Povidone-iodine Sedation:    Sedation type:  None Anesthesia:    Anesthesia method:  Local infiltration   Local  anesthetic:  Lidocaine 2% WITH epi Procedure type:    Complexity:  Simple Procedure details:    Needle aspiration: no     Incision types:  Stab incision   Incision depth:  Subcutaneous   Wound management:  Probed and deloculated   Drainage:  Bloody and purulent   Drainage amount:  Scant   Wound treatment:  Wound left open   Packing materials:  None Post-procedure details:    Procedure completion:  Tolerated Comments:     1.5 cm induration with 4 cm diameter erythema surrounding area.  There was a central opening present before beginning which I believe is from patient reported that popping last night.  I cleaned the wound with povidone iodine swabs and allowed to dry.  I then infiltrated with lidocaine around the area.  I then used a scalpel to open the area.  Only a small amount of pus was deloculated.  I then used ultrasound with Dr. Melina Copa present which showed no additional pockets.  The wound was left open.  I did not pack the wound.  I placed a 4 x 4 gauze and use an ABD bandage for wrapping.  Patient tolerated well.   Medications Ordered in ED Medications  lidocaine-EPINEPHrine (XYLOCAINE W/EPI) 2 %-1:200000 (PF) injection 20 mL (20 mLs Infiltration Given by Other 08/13/21 2000)  doxycycline (VIBRA-TABS) tablet 100 mg (100 mg Oral Given 08/13/21 2146)    ED Course  I have reviewed the triage vital signs and the nursing notes.  Pertinent labs & imaging results that were available during my care of the patient were reviewed by me and considered in my medical decision making (see chart for details).  Clinical Course as of 08/14/21 0040  Mon Aug 13, 6662  2158 40 year old female here with some abdominal pain and also concern for spider bite on the back of her thigh.  Area was I indeed with minimal pus expressed.  Will cover with antibiotics. [MB]    Clinical Course User Index [MB] Hayden Rasmussen, MD   MDM Rules/Calculators/A&P 40 year old female who presents to the emergency  department with history, HPI and physical dam as described above.  Bite -Unsure etiology of initial subjective bite to the back of the right leg. -Attempted to I/D area with minimal pus.  Checked with ultrasound to see if there were any more fluid pockets with Dr. Melina Copa at bedside.  There were no other areas to explore. -She has no other signs of tickborne illness to include joint swelling, rash, hyponatremia, elevated liver enzymes.  However because I am unsure of the origin of the bite ordered [facility] spotted fever and Lyme serology.  We will empirically cover her with doxycycline.   As for her other symptoms including nausea vomiting and diarrhea, and now congestion, sore throat her labs are unremarkable. Strep negative. COVID-negative. Lipase negative.  No leukocytosis.  Afebrile. Hemoglobin 11.6 which is at baseline for patient  I have low suspicion for any acute/emergent causes of her symptoms.  Her abdominal exam is not concerning for acute abdomen. Likely viral syndrome.  Based on the physical I doubt sinusitis.  I do not suspect this to be an underlying cardiopulmonary process.  Considered but unlikely that this is ACS, CHF, pneumonia, pneumothorax.  The patient is nontoxic-appearing without need of emergent medical intervention.  I am discharging her with doxycycline for the next 7 days.  She understands that she needs to take this medication to completion.  She understands that somebody will call her if she has positive tickborne serology.  I have answered all of her questions at bedside.  Vital signs stable.  Safe for discharge.  Final Clinical Impression(s) / ED Diagnoses Final diagnoses:  Nausea vomiting and diarrhea  Abscess    Rx / DC Orders ED Discharge Orders          Ordered    doxycycline (VIBRAMYCIN) 100 MG capsule  2 times daily        08/13/21 2138             Mickie Hillier, PA-C 08/14/21 0053    Hayden Rasmussen, MD 08/14/21 [phone]

## 2021-08-15 LAB — LYME DISEASE SEROLOGY W/REFLEX: Lyme Total Antibody EIA: NEGATIVE

## 2021-08-22 LAB — LYME DISEASE, WESTERN BLOT
IgG P18 Ab.: ABSENT
IgG P23 Ab.: ABSENT
IgG P28 Ab.: ABSENT
IgG P30 Ab.: ABSENT
IgG P39 Ab.: ABSENT
IgG P45 Ab.: ABSENT
IgG P58 Ab.: ABSENT
IgG P66 Ab.: ABSENT
IgG P93 Ab.: ABSENT
IgM P23 Ab.: ABSENT
IgM P39 Ab.: ABSENT
IgM P41 Ab.: ABSENT
Lyme IgG Wb: NEGATIVE
Lyme IgM Wb: NEGATIVE

## 2021-10-11 IMAGING — CR DG CHEST 2V
2 series · 2 of 2 positions shown · non-contrast
Comparison: December 11, 2020

CLINICAL DATA: Chest pain.

EXAM:
CHEST - 2 VIEW

[chest pa]
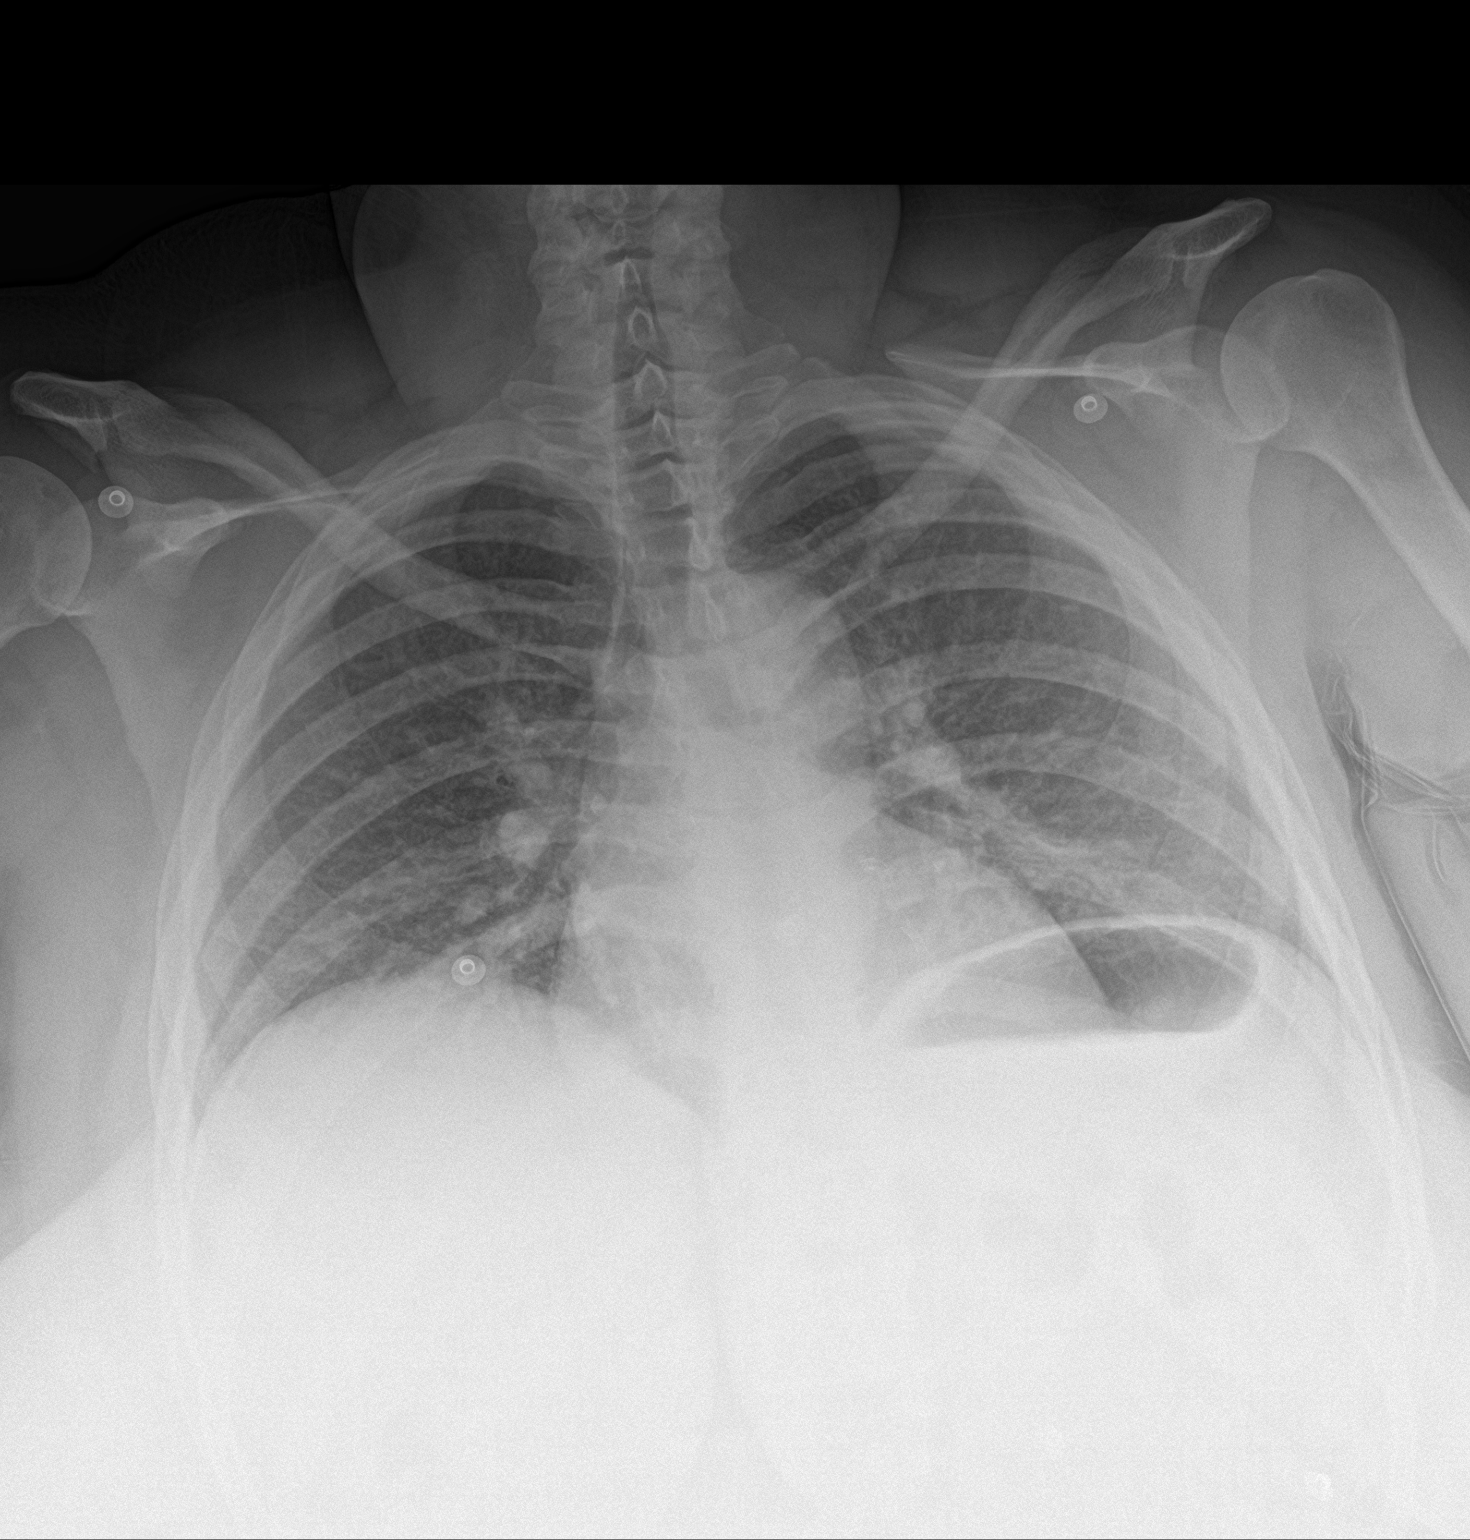

[chest lat]
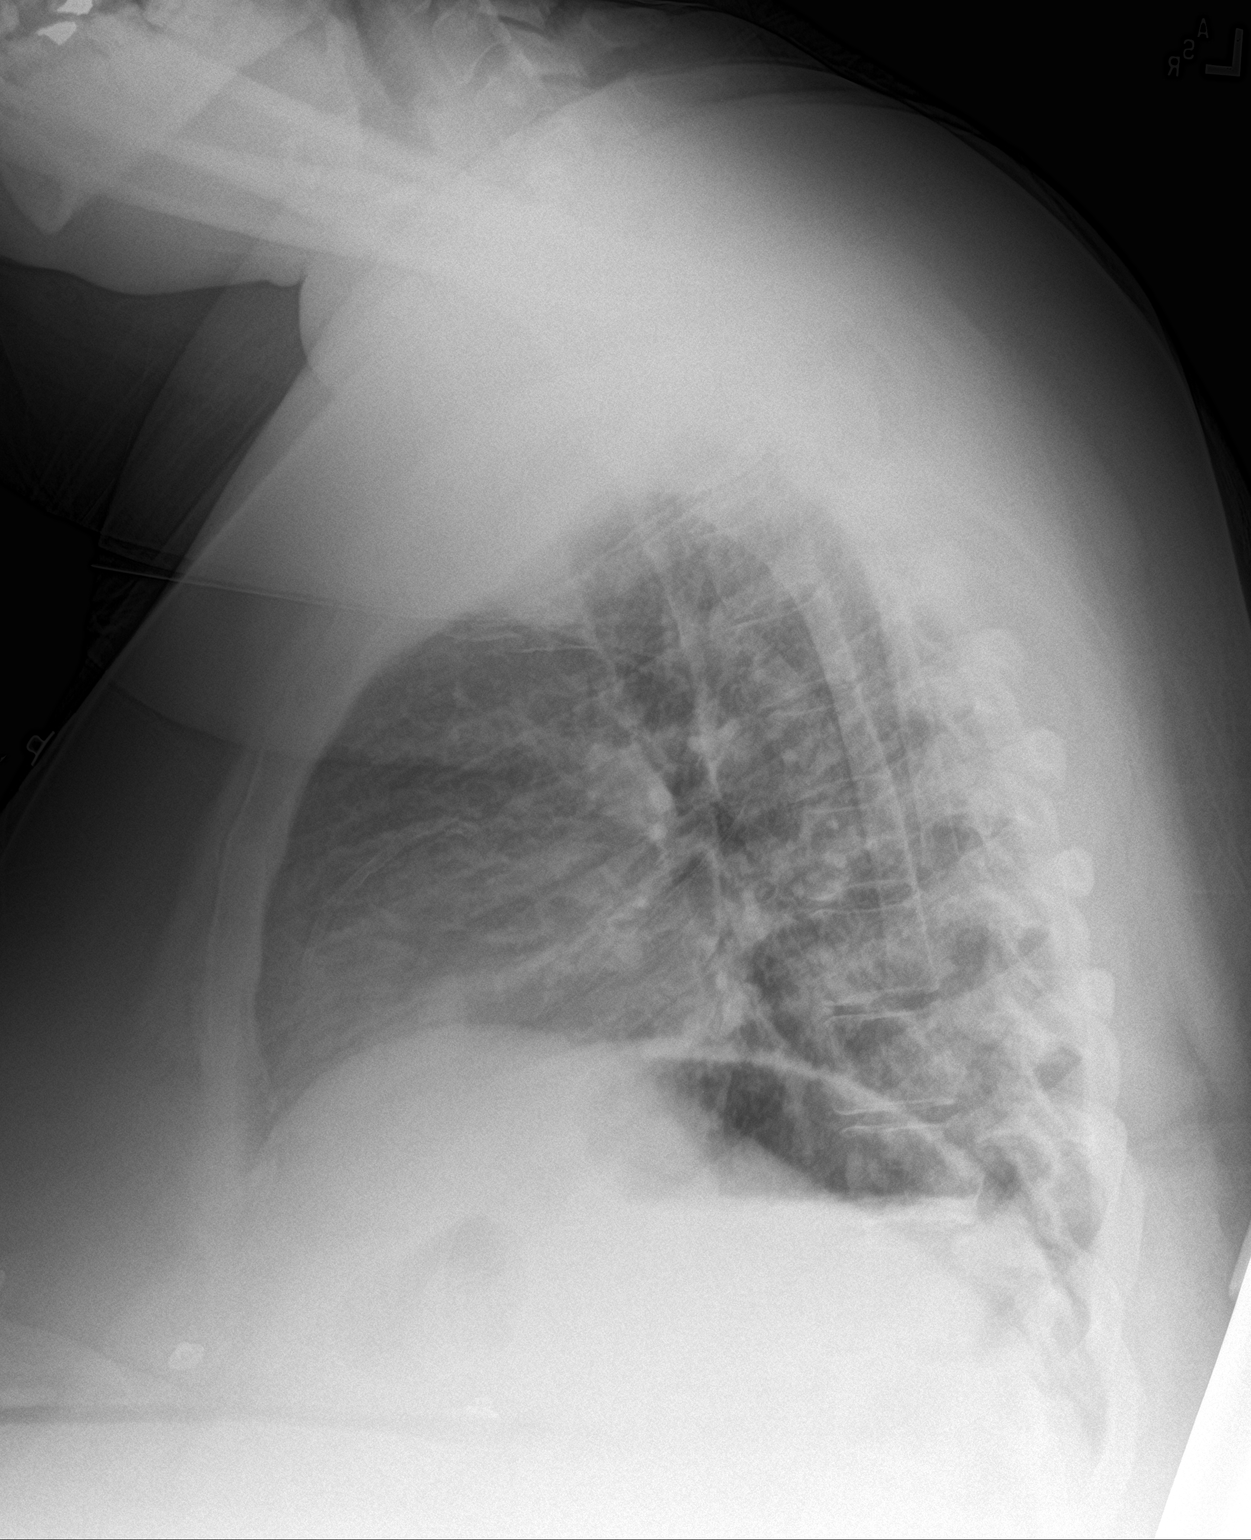

[2 of 2 positions shown; findings below may reference images not displayed]

FINDINGS: The heart size and mediastinal contours are within normal limits.
The lung volumes are low. Mild increased pulmonary interstitium is
identified bilaterally. There is no pleural effusion. Mild patchy
opacity of left lung base is noted. The visualized skeletal
structures are unremarkable.
IMPRESSION: 1. Mild increased pulmonary interstitium bilaterally. This can be
seen in viral infection or mild interstitial edema.
2. Mild patchy opacity of left lung base, developing pneumonia is
not excluded.

## 2022-04-27 ENCOUNTER — Other Ambulatory Visit: Payer: Self-pay | Admitting: Cardiology
